# Patient Record
Sex: Male | Born: 1965
Health system: Southern US, Community
[De-identification: ages and names within clinical notes are randomized; demographics above are authoritative.]

## PROBLEM LIST (undated history)

## (undated) DIAGNOSIS — E785 Hyperlipidemia, unspecified: Secondary | ICD-10-CM

## (undated) DIAGNOSIS — E039 Hypothyroidism, unspecified: Secondary | ICD-10-CM

## (undated) DIAGNOSIS — I10 Essential (primary) hypertension: Secondary | ICD-10-CM

## (undated) HISTORY — PX: BACK SURGERY: SHX140

---

## 2018-07-15 ENCOUNTER — Emergency Department (HOSPITAL_BASED_OUTPATIENT_CLINIC_OR_DEPARTMENT_OTHER)
Admission: EM | Admit: 2018-07-15 | Discharge: 2018-07-15 | Disposition: A | Discharge status: Home / Self Care | Payer: Federal, State, Local not specified - PPO | Attending: Emergency Medicine | Admitting: Emergency Medicine

## 2018-07-15 ENCOUNTER — Other Ambulatory Visit: Payer: Self-pay

## 2018-07-15 ENCOUNTER — Encounter (HOSPITAL_BASED_OUTPATIENT_CLINIC_OR_DEPARTMENT_OTHER): Payer: Self-pay | Admitting: Emergency Medicine

## 2018-07-15 ENCOUNTER — Emergency Department (HOSPITAL_BASED_OUTPATIENT_CLINIC_OR_DEPARTMENT_OTHER): Payer: Federal, State, Local not specified - PPO

## 2018-07-15 DIAGNOSIS — E039 Hypothyroidism, unspecified: Secondary | ICD-10-CM | POA: Diagnosis not present

## 2018-07-15 DIAGNOSIS — R42 Dizziness and giddiness: Secondary | ICD-10-CM | POA: Insufficient documentation

## 2018-07-15 DIAGNOSIS — F17210 Nicotine dependence, cigarettes, uncomplicated: Secondary | ICD-10-CM | POA: Diagnosis not present

## 2018-07-15 DIAGNOSIS — Z20828 Contact with and (suspected) exposure to other viral communicable diseases: Secondary | ICD-10-CM | POA: Insufficient documentation

## 2018-07-15 DIAGNOSIS — H9312 Tinnitus, left ear: Secondary | ICD-10-CM | POA: Diagnosis not present

## 2018-07-15 DIAGNOSIS — H9192 Unspecified hearing loss, left ear: Secondary | ICD-10-CM | POA: Diagnosis not present

## 2018-07-15 HISTORY — DX: Hypothyroidism, unspecified: E03.9

## 2018-07-15 HISTORY — DX: Hyperlipidemia, unspecified: E78.5

## 2018-07-15 LAB — CBC WITH DIFFERENTIAL/PLATELET
Abs Immature Granulocytes: 0.01 10*3/uL (ref 0.00–0.07)
Basophils Absolute: 0 10*3/uL (ref 0.0–0.1)
Basophils Relative: 1 %
Eosinophils Absolute: 0 10*3/uL (ref 0.0–0.5)
Eosinophils Relative: 0 %
HCT: 46.5 % (ref 39.0–52.0)
Hemoglobin: 14.7 g/dL (ref 13.0–17.0)
Immature Granulocytes: 0 %
Lymphocytes Relative: 10 %
Lymphs Abs: 0.6 10*3/uL — ABNORMAL LOW (ref 0.7–4.0)
MCH: 28.9 pg (ref 26.0–34.0)
MCHC: 31.6 g/dL (ref 30.0–36.0)
MCV: 91.4 fL (ref 80.0–100.0)
Monocytes Absolute: 0.5 10*3/uL (ref 0.1–1.0)
Monocytes Relative: 8 %
Neutro Abs: 4.8 10*3/uL (ref 1.7–7.7)
Neutrophils Relative %: 81 %
Platelets: 164 10*3/uL (ref 150–400)
RBC: 5.09 MIL/uL (ref 4.22–5.81)
RDW: 13.6 % (ref 11.5–15.5)
WBC: 5.9 10*3/uL (ref 4.0–10.5)
nRBC: 0 % (ref 0.0–0.2)

## 2018-07-15 LAB — COMPREHENSIVE METABOLIC PANEL
ALT: 16 U/L (ref 0–44)
AST: 20 U/L (ref 15–41)
Albumin: 4.2 g/dL (ref 3.5–5.0)
Alkaline Phosphatase: 54 U/L (ref 38–126)
Anion gap: 8 (ref 5–15)
BUN: 14 mg/dL (ref 6–20)
CO2: 24 mmol/L (ref 22–32)
Calcium: 8.6 mg/dL — ABNORMAL LOW (ref 8.9–10.3)
Chloride: 107 mmol/L (ref 98–111)
Creatinine, Ser: 1.13 mg/dL (ref 0.61–1.24)
GFR calc Af Amer: >60 mL/min (ref >60)
GFR calc non Af Amer: >60 mL/min (ref >60)
Glucose, Bld: 101 mg/dL — ABNORMAL HIGH (ref 70–99)
Potassium: 3.9 mmol/L (ref 3.5–5.1)
Sodium: 139 mmol/L (ref 135–145)
Total Bilirubin: 0.9 mg/dL (ref 0.3–1.2)
Total Protein: 7.2 g/dL (ref 6.5–8.1)

## 2018-07-15 LAB — URINALYSIS, ROUTINE W REFLEX MICROSCOPIC
Bilirubin Urine: NEGATIVE
Glucose, UA: NEGATIVE mg/dL
Hgb urine dipstick: NEGATIVE
Ketones, ur: NEGATIVE mg/dL
Leukocytes,Ua: NEGATIVE
Nitrite: NEGATIVE
Protein, ur: NEGATIVE mg/dL
Specific Gravity, Urine: 1.02 (ref 1.005–1.030)
pH: 6.5 (ref 5.0–8.0)

## 2018-07-15 LAB — TROPONIN I (HIGH SENSITIVITY)
Troponin I (High Sensitivity): 12 ng/L (ref <18)
Troponin I (High Sensitivity): 5 ng/L (ref <18)

## 2018-07-15 MED ORDER — MECLIZINE HCL 25 MG PO TABS: 25.0000 mg | ORAL_TABLET | Freq: Three times a day (TID) | ORAL | 0 refills | Status: DC | PRN

## 2018-07-15 MED ORDER — MECLIZINE HCL 25 MG PO TABS
25.0000 mg | ORAL_TABLET | Freq: Once | ORAL | Status: AC
  Administered 2018-07-15: 25 mg via ORAL
  Filled 2018-07-15: qty 1

## 2018-07-15 MED ORDER — SODIUM CHLORIDE 0.9 % IV BOLUS
1000.0000 mL | Freq: Once | INTRAVENOUS | Status: AC
  Administered 2018-07-15: 1000 mL via INTRAVENOUS

## 2018-07-15 MED FILL — MECLIZINE 25 MG TABLET: 25 | 10 days supply | Qty: 30 | Fill #0

## 2018-07-15 NOTE — ED Triage Notes (Signed)
Pt sent from [facility] with dizziness that persisted a bit longer than his normal vertigo. Paperwork from that facility shows normal EKG. Dizziness is improving. Hx of high cholesterol and hypothyroid.

## 2018-07-15 NOTE — ED Notes (Signed)
Pt walked to XR.

## 2018-07-15 NOTE — ED Provider Notes (Signed)
[facility] EMERGENCY DEPARTMENT Provider Note   CSN: [id number] Arrival date & time: 07/15/18  1027    History   Chief Complaint Chief Complaint  Patient presents with  . Dizziness    HPI Michael Castillo is a 53 y.o. male.     Pt presents to the ED today with dizziness.  Pt said it started on his way to work this morning.  He has vertigo, but usually that goes away quickly.  This did not.  He called his wife after he did not feel safe driving.  She initially took him to urgent care who sent him here.  Pt's wife requesting a covid test.  He denies any recent exposures.  No sob or cough or fevers.  Dizziness is improving.  He has tinnitus in his left ear and hearing loss, but those are both chronic.     Past Medical History:  Diagnosis Date  . Hyperlipidemia   . Hypothyroidism     There are no active problems to display for this patient.   Past Surgical History:  Procedure Laterality Date  . BACK SURGERY          Home Medications    Prior to Admission medications   Medication Sig Start Date End Date Taking? Authorizing Provider  meclizine (ANTIVERT) 25 MG tablet Take 1 tablet (25 mg total) by mouth 3 (three) times daily as needed for dizziness. 07/15/18   Isla Pence, MD    Family History History reviewed. No pertinent family history.  Social History Social History   Tobacco Use  . Smoking status: Current Every Day Smoker    Types: Cigarettes  . Smokeless tobacco: Never Used  Substance Use Topics  . Alcohol use: Yes    Comment: occ  . Drug use: Never     Allergies   Patient has no allergy information on record.   Review of Systems Review of Systems  HENT: Positive for hearing loss and tinnitus.   Neurological: Positive for dizziness.  All other systems reviewed and are negative.    Physical Exam Updated Vital Signs BP 126/86 (BP Location: Right Arm)   Pulse 73   Temp 97.6 F (36.4 C) (Oral)   Resp 18   Ht 6\' 2"  (1.88 m)    Wt 95.3 kg   SpO2 99%   BMI 26.96 kg/m   Physical Exam Vitals signs and nursing note reviewed.  Constitutional:      Appearance: Normal appearance.  HENT:     Head: Normocephalic and atraumatic.     Right Ear: External ear normal.     Left Ear: External ear normal.     Nose: Nose normal.     Mouth/Throat:     Mouth: Mucous membranes are moist.     Pharynx: Oropharynx is clear.  Eyes:     Extraocular Movements: Extraocular movements intact.     Conjunctiva/sclera: Conjunctivae normal.     Pupils: Pupils are equal, round, and reactive to light.  Neck:     Musculoskeletal: Normal range of motion and neck supple.  Cardiovascular:     Rate and Rhythm: Normal rate and regular rhythm.     Pulses: Normal pulses.     Heart sounds: Normal heart sounds.  Pulmonary:     Effort: Pulmonary effort is normal.     Breath sounds: Normal breath sounds.  Abdominal:     General: Abdomen is flat. Bowel sounds are normal.     Palpations: Abdomen is soft.  Musculoskeletal: Normal  range of motion.  Skin:    General: Skin is warm.     Capillary Refill: Capillary refill takes less than 2 seconds.  Neurological:     General: No focal deficit present.     Mental Status: He is alert and oriented to person, place, and time.  Psychiatric:        Mood and Affect: Mood normal.        Behavior: Behavior normal.      ED Treatments / Results  Labs (all labs ordered are listed, but only abnormal results are displayed) Labs Reviewed  CBC WITH DIFFERENTIAL/PLATELET - Abnormal; Notable for the following components:      Result Value   Lymphs Abs 0.6 (*)    All other components within normal limits  COMPREHENSIVE METABOLIC PANEL - Abnormal; Notable for the following components:   Glucose, Bld 101 (*)    Calcium 8.6 (*)    All other components within normal limits  NOVEL CORONAVIRUS, NAA (HOSPITAL ORDER, SEND-OUT TO REF LAB)  URINALYSIS, ROUTINE W REFLEX MICROSCOPIC  TROPONIN I (HIGH SENSITIVITY)   TROPONIN I (HIGH SENSITIVITY)    EKG EKG Interpretation  Date/Time:  Wednesday July 15 2018 11:00:53 EDT Ventricular Rate:  57 PR Interval:    QRS Duration: 97 QT Interval:  425 QTC Calculation: 414 R Axis:   70 Text Interpretation:  Sinus rhythm Minimal ST elevation, inferior leads No old tracing to compare Confirmed by Jacalyn LefevreHaviland, Rolen Conger [phone]([id number]) on 07/15/2018 1:42:32 PM   Radiology Ct Head Wo Contrast  Result Date: 07/15/2018 CLINICAL DATA:  53 year old male with acute dizziness while driving to work this morning. EXAM: CT HEAD WITHOUT CONTRAST TECHNIQUE: Contiguous axial images were obtained from the base of the skull through the vertex without intravenous contrast. COMPARISON:  None. FINDINGS: Brain: Cerebral volume is within normal limits for age. No midline shift, ventriculomegaly, mass effect, evidence of mass lesion, intracranial hemorrhage or evidence of cortically based acute infarction. Gray-white matter differentiation is within normal limits throughout the brain. Vascular: Mild Calcified atherosclerosis at the skull base. No suspicious intracranial vascular hyperdensity. Skull: Negative. Sinuses/Orbits: Visualized paranasal sinuses and mastoids are clear. Tympanic cavities are clear. Other: No acute orbit or scalp soft tissue findings. IMPRESSION: Normal noncontrast CT appearance of the brain. Electronically Signed   By: Odessa FlemingH  Hall M.D.   On: 07/15/2018 11:11    Procedures Procedures (including critical care time)  Medications Ordered in ED Medications  sodium chloride 0.9 % bolus 1,000 mL (0 mLs Intravenous Stopped 07/15/18 1213)  meclizine (ANTIVERT) tablet 25 mg (25 mg Oral Given 07/15/18 1110)     Initial Impression / Assessment and Plan / ED Course  I have reviewed the triage vital signs and the nursing notes.  Pertinent labs & imaging results that were available during my care of the patient were reviewed by me and considered in my medical decision making (see chart for  details).    Covid test pending.   Pt is feeling much better.  He is able to ambulate without problems.  Return if worse.  Waneta MartinsJames Stollings was evaluated in Emergency Department on 07/15/2018 for the symptoms described in the history of present illness. He was evaluated in the context of the global COVID-19 pandemic, which necessitated consideration that the patient might be at risk for infection with the SARS-CoV-2 virus that causes COVID-19. Institutional protocols and algorithms that pertain to the evaluation of patients at risk for COVID-19 are in a state of rapid change based on information  released by regulatory bodies including the CDC and federal and state organizations. These policies and algorithms were followed during the patient's care in the ED.  Final Clinical Impressions(s) / ED Diagnoses   Final diagnoses:  Vertigo    ED Discharge Orders         Ordered    meclizine (ANTIVERT) 25 MG tablet  3 times daily PRN     07/15/18 1352           Jacalyn LefevreHaviland, Emya Picado, MD 07/15/18 1354

## 2018-07-15 NOTE — ED Notes (Signed)
Spoke with wife to update on condition.  

## 2018-07-15 NOTE — ED Notes (Signed)
Pt given urinal.

## 2018-07-15 NOTE — ED Notes (Signed)
Pt wife stated that pt called her and said he was cold and needed to use the restroom. I redirected the patient to his call bell if he needed anything and let him know that I am always happy to help him with anything he needs.

## 2018-07-17 LAB — NOVEL CORONAVIRUS, NAA (HOSP ORDER, SEND-OUT TO REF LAB; TAT 18-24 HRS): SARS-CoV-2, NAA: NOT DETECTED

## 2020-02-28 IMAGING — CT CT HEAD WITHOUT CONTRAST
3 series · 16 of 47 positions shown, 19 images · non-contrast
Comparison: None.

CLINICAL DATA: 53-year-old male with acute dizziness while driving
to work this morning.

EXAM:
CT HEAD WITHOUT CONTRAST
TECHNIQUE: Contiguous axial images were obtained from the base of the skull
through the vertex without intravenous contrast.

[Series 2: head wo · axial · 0.42mm/px · z∈[-189,-44]mm · 10 of 35 slices shown, 13 images]
[im 3/35  brain]
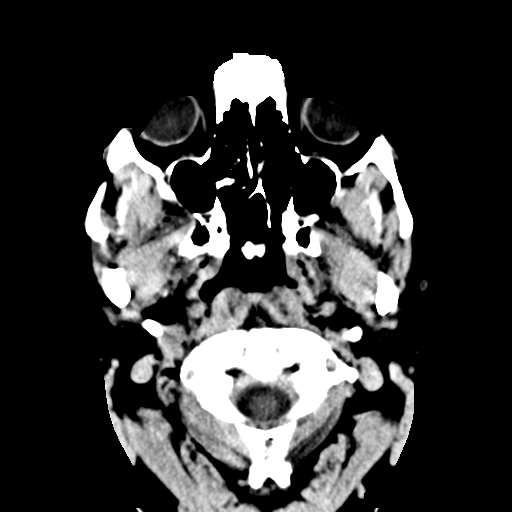
[im 3/35  bone]
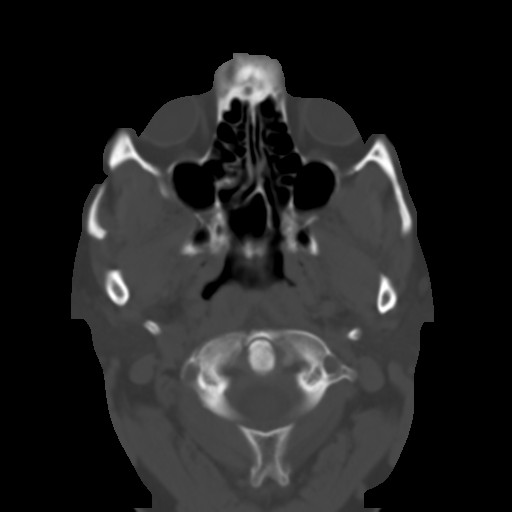
[im 6/35  brain]
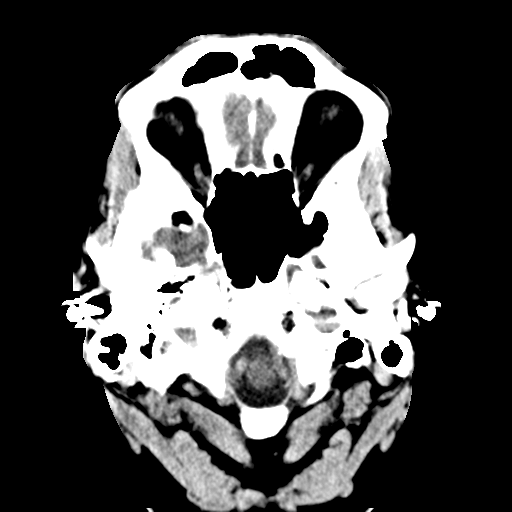
[im 10/35  brain]
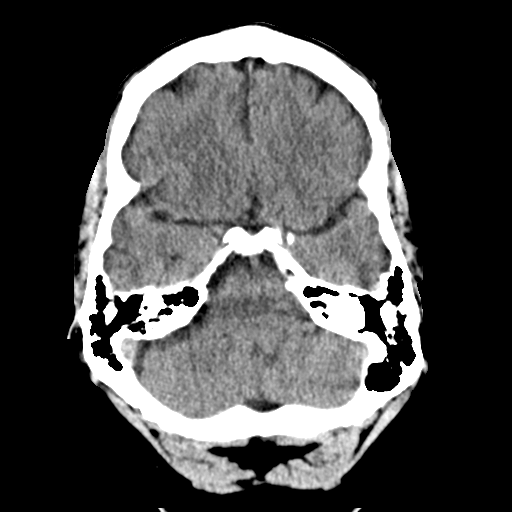
[im 12/35  brain]
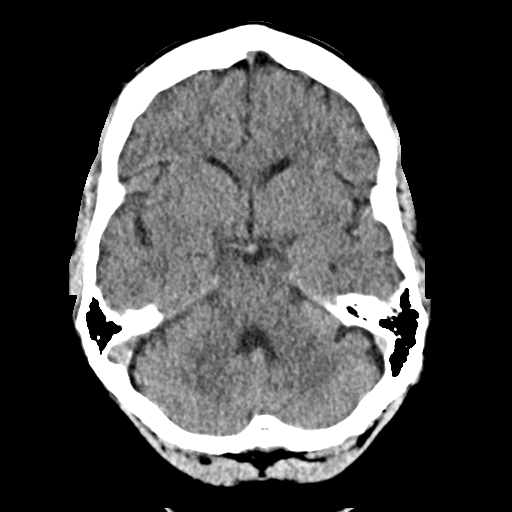
[im 16/35  brain]
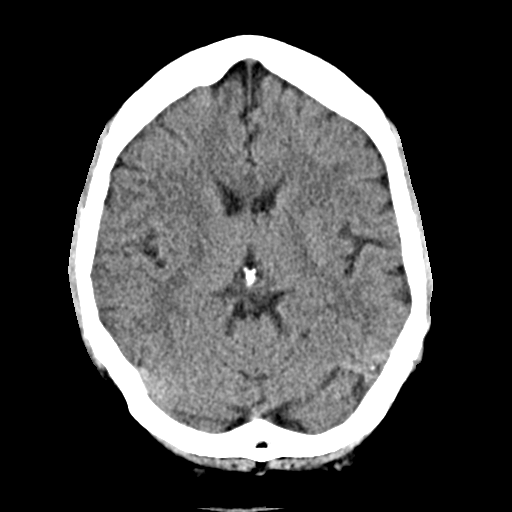
[im 16/35  bone]
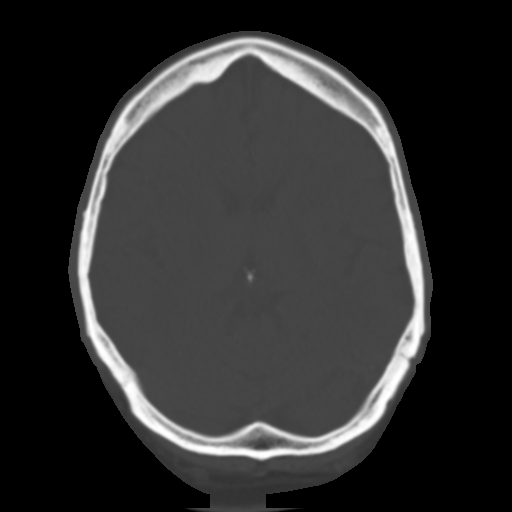
[im 19/35  brain]
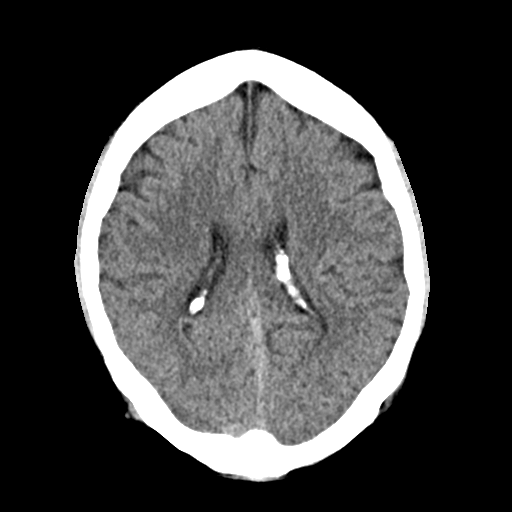
[im 23/35  brain]
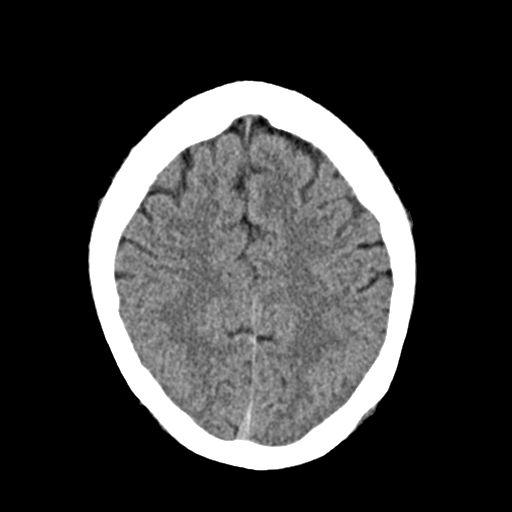
[im 26/35  brain]
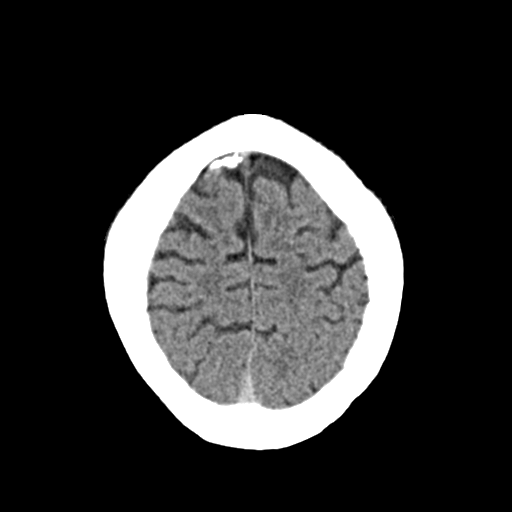
[im 29/35  brain]
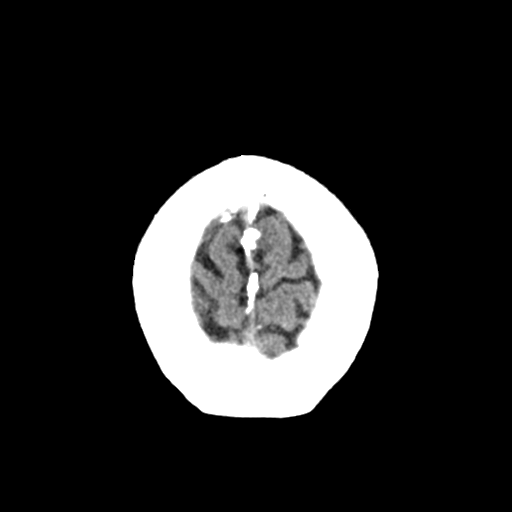
[im 29/35  bone]
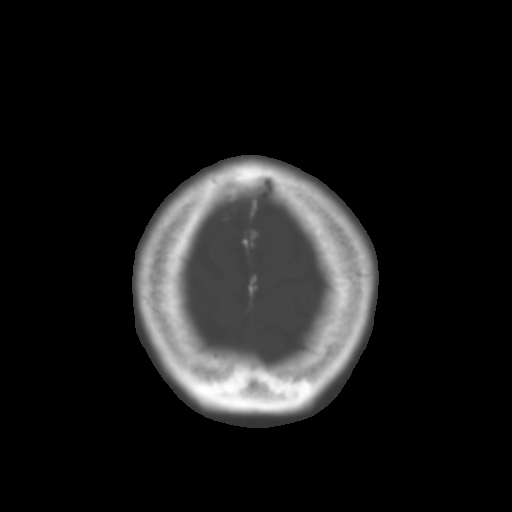
[im 32/35  brain]
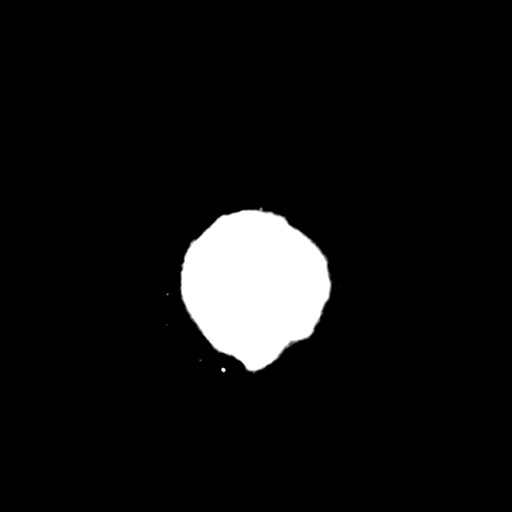

[Series 4: coronal soft · coronal · 0.35mm/px · 3 of 73 slices shown]
[im 25/73  brain]
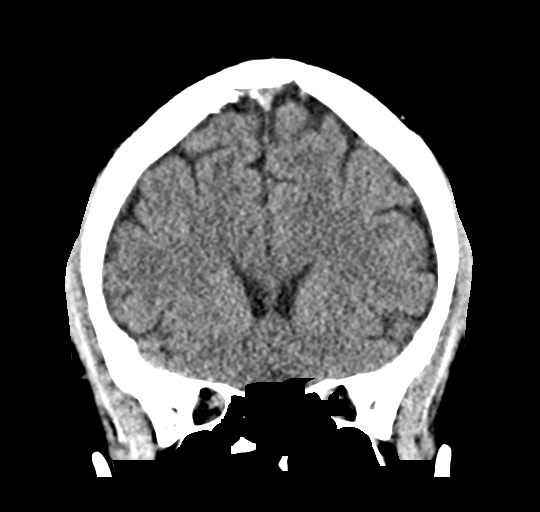
[im 33/73  brain]
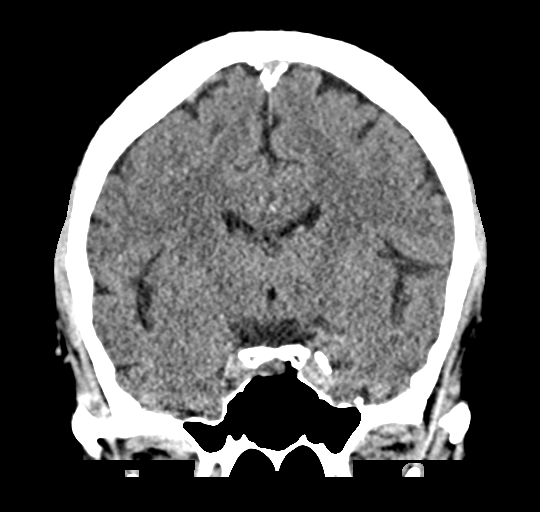
[im 41/73  brain]
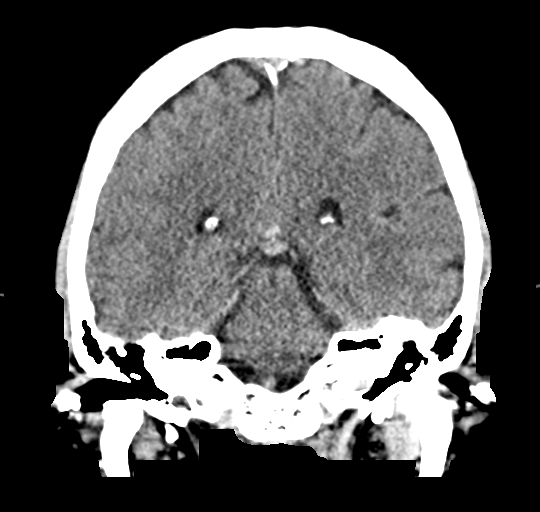

[Series 5: sag soft · sagittal · 0.36mm/px · 3 of 58 slices shown]
[im 20/58  brain]
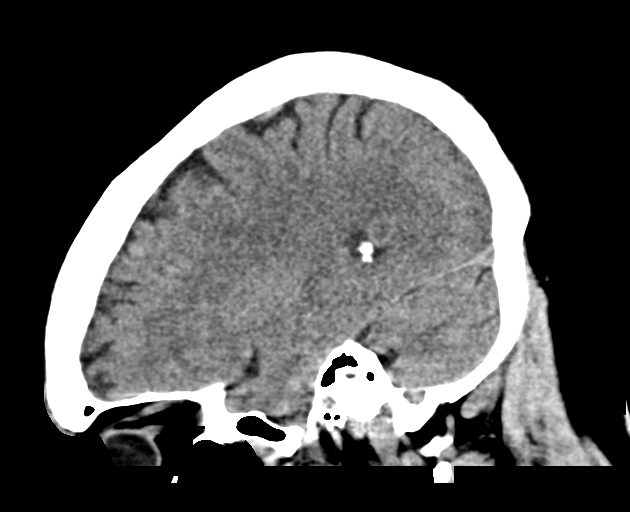
[im 29/58  brain]
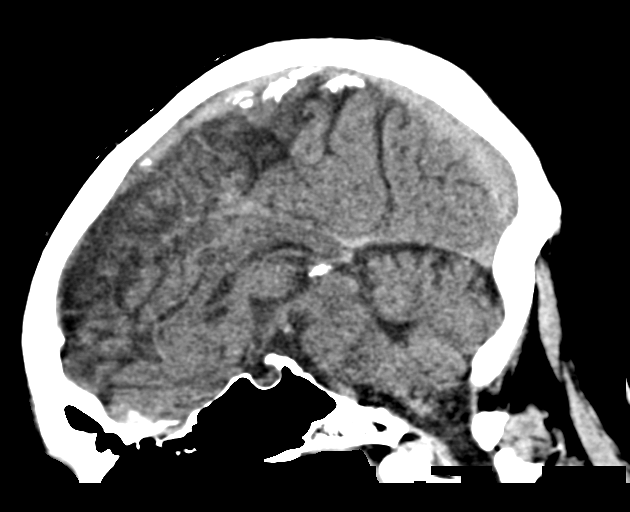
[im 39/58  brain]
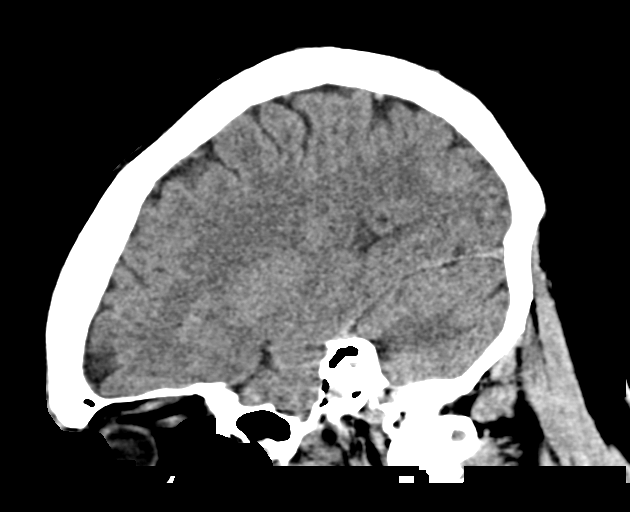

[16 of 47 positions shown; findings below may reference images not displayed]

FINDINGS: Brain: Cerebral volume is within normal limits for age. No midline
shift, ventriculomegaly, mass effect, evidence of mass lesion,
intracranial hemorrhage or evidence of cortically based acute
infarction. Gray-white matter differentiation is within normal
limits throughout the brain.

Vascular: Mild Calcified atherosclerosis at the skull base. No
suspicious intracranial vascular hyperdensity.

Skull: Negative.

Sinuses/Orbits: Visualized paranasal sinuses and mastoids are clear.
Tympanic cavities are clear.

Other: No acute orbit or scalp soft tissue findings.
IMPRESSION: Normal noncontrast CT appearance of the brain.

## 2020-12-27 ENCOUNTER — Emergency Department (HOSPITAL_BASED_OUTPATIENT_CLINIC_OR_DEPARTMENT_OTHER): Payer: Federal, State, Local not specified - PPO

## 2020-12-27 ENCOUNTER — Other Ambulatory Visit: Payer: Self-pay

## 2020-12-27 ENCOUNTER — Observation Stay (HOSPITAL_BASED_OUTPATIENT_CLINIC_OR_DEPARTMENT_OTHER)
Admission: EM | Admit: 2020-12-27 | Discharge: 2020-12-29 | Disposition: A | Discharge status: Home / Self Care | Payer: Federal, State, Local not specified - PPO | Attending: Internal Medicine | Admitting: Internal Medicine

## 2020-12-27 ENCOUNTER — Encounter (HOSPITAL_BASED_OUTPATIENT_CLINIC_OR_DEPARTMENT_OTHER): Payer: Self-pay

## 2020-12-27 DIAGNOSIS — F17210 Nicotine dependence, cigarettes, uncomplicated: Secondary | ICD-10-CM | POA: Insufficient documentation

## 2020-12-27 DIAGNOSIS — E039 Hypothyroidism, unspecified: Secondary | ICD-10-CM | POA: Insufficient documentation

## 2020-12-27 DIAGNOSIS — Z20822 Contact with and (suspected) exposure to covid-19: Secondary | ICD-10-CM | POA: Insufficient documentation

## 2020-12-27 DIAGNOSIS — Z79899 Other long term (current) drug therapy: Secondary | ICD-10-CM | POA: Insufficient documentation

## 2020-12-27 DIAGNOSIS — I129 Hypertensive chronic kidney disease with stage 1 through stage 4 chronic kidney disease, or unspecified chronic kidney disease: Secondary | ICD-10-CM | POA: Insufficient documentation

## 2020-12-27 DIAGNOSIS — N179 Acute kidney failure, unspecified: Secondary | ICD-10-CM | POA: Insufficient documentation

## 2020-12-27 DIAGNOSIS — E669 Obesity, unspecified: Secondary | ICD-10-CM

## 2020-12-27 DIAGNOSIS — E861 Hypovolemia: Secondary | ICD-10-CM | POA: Diagnosis not present

## 2020-12-27 DIAGNOSIS — K922 Gastrointestinal hemorrhage, unspecified: Principal | ICD-10-CM | POA: Insufficient documentation

## 2020-12-27 DIAGNOSIS — E785 Hyperlipidemia, unspecified: Secondary | ICD-10-CM

## 2020-12-27 DIAGNOSIS — N1831 Chronic kidney disease, stage 3a: Secondary | ICD-10-CM | POA: Diagnosis not present

## 2020-12-27 DIAGNOSIS — K5790 Diverticulosis of intestine, part unspecified, without perforation or abscess without bleeding: Secondary | ICD-10-CM

## 2020-12-27 DIAGNOSIS — K921 Melena: Secondary | ICD-10-CM

## 2020-12-27 HISTORY — DX: Essential (primary) hypertension: I10

## 2020-12-27 LAB — CBC WITH DIFFERENTIAL/PLATELET
Abs Immature Granulocytes: 0 10*3/uL (ref 0.00–0.07)
Abs Immature Granulocytes: 0.02 10*3/uL (ref 0.00–0.07)
Abs Immature Granulocytes: 0.04 10*3/uL (ref 0.00–0.07)
Basophils Absolute: 0 10*3/uL (ref 0.0–0.1)
Basophils Absolute: 0 10*3/uL (ref 0.0–0.1)
Basophils Absolute: 0 10*3/uL (ref 0.0–0.1)
Basophils Relative: 1 %
Basophils Relative: 0 %
Basophils Relative: 0 %
Eosinophils Absolute: 0.1 10*3/uL (ref 0.0–0.5)
Eosinophils Absolute: 0 10*3/uL (ref 0.0–0.5)
Eosinophils Absolute: 0 10*3/uL (ref 0.0–0.5)
Eosinophils Relative: 1 %
Eosinophils Relative: 0 %
Eosinophils Relative: 0 %
HCT: 38.6 % — ABNORMAL LOW (ref 39.0–52.0)
HCT: 32.6 % — ABNORMAL LOW (ref 39.0–52.0)
HCT: 33.5 % — ABNORMAL LOW (ref 39.0–52.0)
Hemoglobin: 12.5 g/dL — ABNORMAL LOW (ref 13.0–17.0)
Hemoglobin: 10.8 g/dL — ABNORMAL LOW (ref 13.0–17.0)
Hemoglobin: 10.8 g/dL — ABNORMAL LOW (ref 13.0–17.0)
Immature Granulocytes: 0 %
Immature Granulocytes: 0 %
Immature Granulocytes: 1 %
Lymphocytes Relative: 37 %
Lymphocytes Relative: 15 %
Lymphocytes Relative: 12 %
Lymphs Abs: 1.3 10*3/uL (ref 0.7–4.0)
Lymphs Abs: 1 10*3/uL (ref 0.7–4.0)
Lymphs Abs: 0.8 10*3/uL (ref 0.7–4.0)
MCH: 28.8 pg (ref 26.0–34.0)
MCH: 29.4 pg (ref 26.0–34.0)
MCH: 29 pg (ref 26.0–34.0)
MCHC: 32.4 g/dL (ref 30.0–36.0)
MCHC: 33.1 g/dL (ref 30.0–36.0)
MCHC: 32.2 g/dL (ref 30.0–36.0)
MCV: 88.9 fL (ref 80.0–100.0)
MCV: 88.8 fL (ref 80.0–100.0)
MCV: 89.8 fL (ref 80.0–100.0)
Monocytes Absolute: 0.4 10*3/uL (ref 0.1–1.0)
Monocytes Absolute: 0.6 10*3/uL (ref 0.1–1.0)
Monocytes Absolute: 0.4 10*3/uL (ref 0.1–1.0)
Monocytes Relative: 12 %
Monocytes Relative: 9 %
Monocytes Relative: 7 %
Neutro Abs: 1.7 10*3/uL (ref 1.7–7.7)
Neutro Abs: 5.4 10*3/uL (ref 1.7–7.7)
Neutro Abs: 5.4 10*3/uL (ref 1.7–7.7)
Neutrophils Relative %: 49 %
Neutrophils Relative %: 76 %
Neutrophils Relative %: 80 %
Platelets: 195 10*3/uL (ref 150–400)
Platelets: 134 10*3/uL — ABNORMAL LOW (ref 150–400)
Platelets: 158 10*3/uL (ref 150–400)
RBC: 4.34 MIL/uL (ref 4.22–5.81)
RBC: 3.67 MIL/uL — ABNORMAL LOW (ref 4.22–5.81)
RBC: 3.73 MIL/uL — ABNORMAL LOW (ref 4.22–5.81)
RDW: 13.7 % (ref 11.5–15.5)
RDW: 13.7 % (ref 11.5–15.5)
RDW: 13.9 % (ref 11.5–15.5)
WBC: 3.5 10*3/uL — ABNORMAL LOW (ref 4.0–10.5)
WBC: 7.1 10*3/uL (ref 4.0–10.5)
WBC: 6.7 10*3/uL (ref 4.0–10.5)
nRBC: 0 % (ref 0.0–0.2)
nRBC: 0 % (ref 0.0–0.2)
nRBC: 0 % (ref 0.0–0.2)

## 2020-12-27 LAB — COMPREHENSIVE METABOLIC PANEL
ALT: 21 U/L (ref 0–44)
AST: 25 U/L (ref 15–41)
Albumin: 3.6 g/dL (ref 3.5–5.0)
Alkaline Phosphatase: 45 U/L (ref 38–126)
Anion gap: 8 (ref 5–15)
BUN: 18 mg/dL (ref 6–20)
CO2: 23 mmol/L (ref 22–32)
Calcium: 8.7 mg/dL — ABNORMAL LOW (ref 8.9–10.3)
Chloride: 105 mmol/L (ref 98–111)
Creatinine, Ser: 1.45 mg/dL — ABNORMAL HIGH (ref 0.61–1.24)
GFR, Estimated: 56 mL/min — ABNORMAL LOW (ref >60)
Glucose, Bld: 160 mg/dL — ABNORMAL HIGH (ref 70–99)
Potassium: 4 mmol/L (ref 3.5–5.1)
Sodium: 136 mmol/L (ref 135–145)
Total Bilirubin: 0.7 mg/dL (ref 0.3–1.2)
Total Protein: 6.7 g/dL (ref 6.5–8.1)

## 2020-12-27 LAB — OCCULT BLOOD X 1 CARD TO LAB, STOOL: Fecal Occult Bld: POSITIVE — AB

## 2020-12-27 LAB — RESP PANEL BY RT-PCR (FLU A&B, COVID) ARPGX2
Influenza A by PCR: NEGATIVE
Influenza B by PCR: NEGATIVE
SARS Coronavirus 2 by RT PCR: NEGATIVE

## 2020-12-27 LAB — IRON AND TIBC
Iron: 85 ug/dL (ref 45–182)
Saturation Ratios: 29 % (ref 17.9–39.5)
TIBC: 289 ug/dL (ref 250–450)
UIBC: 204 ug/dL

## 2020-12-27 LAB — LIPASE, BLOOD: Lipase: 28 U/L (ref 11–51)

## 2020-12-27 LAB — PROTIME-INR
INR: 1.1 (ref 0.8–1.2)
Prothrombin Time: 14.3 seconds (ref 11.4–15.2)

## 2020-12-27 LAB — TYPE AND SCREEN
ABO/RH(D): O POS
Antibody Screen: NEGATIVE

## 2020-12-27 LAB — MRSA NEXT GEN BY PCR, NASAL: MRSA by PCR Next Gen: NOT DETECTED

## 2020-12-27 MED ORDER — MIDAZOLAM HCL 2 MG/2ML IJ SOLN
INTRAMUSCULAR | Status: AC
  Filled 2020-12-27: qty 2

## 2020-12-27 MED ORDER — ACETAMINOPHEN 325 MG PO TABS: 650.0000 mg | ORAL_TABLET | Freq: Four times a day (QID) | ORAL | Status: DC | PRN

## 2020-12-27 MED ORDER — SODIUM CHLORIDE 0.9 % IV SOLN: Freq: Once | INTRAVENOUS | Status: AC

## 2020-12-27 MED ORDER — CHLORHEXIDINE GLUCONATE CLOTH 2 % EX PADS
6.0000 | MEDICATED_PAD | Freq: Every day | CUTANEOUS | Status: DC
  Administered 2020-12-27 – 2020-12-28 (×2): 6 via TOPICAL

## 2020-12-27 MED ORDER — ACETAMINOPHEN 650 MG RE SUPP: 650.0000 mg | Freq: Four times a day (QID) | RECTAL | Status: DC | PRN

## 2020-12-27 MED ORDER — ONDANSETRON HCL 4 MG PO TABS: 4.0000 mg | ORAL_TABLET | Freq: Four times a day (QID) | ORAL | Status: DC | PRN

## 2020-12-27 MED ORDER — SODIUM CHLORIDE 0.9% FLUSH
3.0000 mL | Freq: Two times a day (BID) | INTRAVENOUS | Status: DC
  Administered 2020-12-27 – 2020-12-29 (×3): 3 mL via INTRAVENOUS

## 2020-12-27 MED ORDER — SODIUM CHLORIDE 0.9 % IV BOLUS
1000.0000 mL | Freq: Once | INTRAVENOUS | Status: AC
  Administered 2020-12-27: 14:00:00 1000 mL via INTRAVENOUS

## 2020-12-27 MED ORDER — ONDANSETRON HCL 4 MG/2ML IJ SOLN: 4.0000 mg | Freq: Four times a day (QID) | INTRAMUSCULAR | Status: DC | PRN

## 2020-12-27 MED ORDER — SODIUM CHLORIDE 0.9 % IV BOLUS: 1000.0000 mL | Freq: Once | INTRAVENOUS | Status: DC

## 2020-12-27 MED ORDER — SODIUM CHLORIDE 0.9 % IV BOLUS
1000.0000 mL | Freq: Once | INTRAVENOUS | Status: AC
  Administered 2020-12-27: 10:00:00 1000 mL via INTRAVENOUS

## 2020-12-27 MED ORDER — IOHEXOL 350 MG/ML SOLN
100.0000 mL | Freq: Once | INTRAVENOUS | Status: AC | PRN
  Administered 2020-12-27: 10:00:00 100 mL via INTRAVENOUS

## 2020-12-27 NOTE — H&P (Signed)
History and Physical    Dahir Ayer GQQ:[id number] DOB: 03-19-1965 DOA: 12/27/2020  PCP: Macy Mis, MD  Patient coming from: work  Chief Complaint: bloody BM  HPI: Michael Castillo is a 55 y.o. male with medical history significant of HLD, hypothyroidism, HTN. Presenting with rectal bleeding. Symptoms started this morning. He had a BM this morning that was hard. At the time, he noticed that it had blood mixed in the stool and some blood on his toilet tissue. He did not have any abdominal or rectal pain. He dismissed it and went to work. At work, he had additional BMs that were loose and bloody. He did not have any N/V, CP, or lightheadedness/dizziness. After having a third bloody BM at work, he decided to come to the ED.    ED Course: He was noted to have an Hgb of 12.5. He had an additional bloody BM where he had what appeared to be a syncopal or near syncopal episode. He became hypotensive and weakly responsive. He recovered and was fluid resuscitated. [facility] was consulted. [facility] was called for admission.   Review of Systems:  Denies CP, dyspnea, palpitations, abdominal pain, rectal pain, N/V, fever. Review of systems is otherwise negative for all not mentioned in HPI.   PMHx Past Medical History:  Diagnosis Date   Hyperlipidemia    Hypertension    Hypothyroidism     PSHx Past Surgical History:  Procedure Laterality Date   BACK SURGERY      SocHx  reports that he has been smoking cigarettes. He has never used smokeless tobacco. He reports current alcohol use. He reports that he does not use drugs.  No Known Allergies  FamHx No family history on file.  Prior to Admission medications   Medication Sig Start Date End Date Taking? Authorizing Provider  atorvastatin (LIPITOR) 40 MG tablet Take 40 mg by mouth daily. 12/27/20   [provider]  levothyroxine (SYNTHROID) 112 MCG tablet Take 112 mcg by mouth daily. 12/17/20   [provider]  meclizine  (ANTIVERT) 25 MG tablet Take 1 tablet (25 mg total) by mouth 3 (three) times daily as needed for dizziness. 07/15/18   Jacalyn Lefevre, MD    Physical Exam: Vitals:   12/27/20 1355 12/27/20 1358 12/27/20 1400 12/27/20 1500  BP: 97/60 102/60 102/60 136/79  Pulse: 74 71 71 69  Resp: 16 16 14  (!) 22  Temp:  97.9 F (36.6 C)  98 F (36.7 C)  TempSrc:  Oral  Oral  SpO2: 98% 100% 98% 100%  Weight:      Height:        General: 55 y.o. male resting in bed in NAD Eyes: PERRL, normal sclera ENMT: Nares patent w/o discharge, orophaynx clear, dentition normal, ears w/o discharge/lesions/ulcers Neck: Supple, trachea midline Cardiovascular: RRR, +S1, S2, no m/g/r, equal pulses throughout Respiratory: CTABL, no w/r/r, normal WOB GI: BS+, NDNT, no masses noted, no organomegaly noted MSK: No e/c/c Neuro: A&O x 3, no focal deficits Psyc: Appropriate interaction and affect, calm/cooperative  Labs on Admission: I have personally reviewed following labs and imaging studies  CBC: Recent Labs  Lab 12/27/20 0939 12/27/20 1340  WBC 3.5* 7.1  NEUTROABS 1.7 5.4  HGB 12.5* 10.8*  HCT 38.6* 32.6*  MCV 88.9 88.8  PLT 195 134*   Basic Metabolic Panel: Recent Labs  Lab 12/27/20 0939  NA 136  K 4.0  CL 105  CO2 23  GLUCOSE 160*  BUN 18  CREATININE 1.45*  CALCIUM 8.7*   GFR: Estimated Creatinine Clearance: 78.2 mL/min (A) (by C-G formula based on SCr of 1.45 mg/dL (H)). Liver Function Tests: Recent Labs  Lab 12/27/20 0939  AST 25  ALT 21  ALKPHOS 45  BILITOT 0.7  PROT 6.7  ALBUMIN 3.6   Recent Labs  Lab 12/27/20 0939  LIPASE 28   No results for input(s): AMMONIA in the last 168 hours. Coagulation Profile: Recent Labs  Lab 12/27/20 0939  INR 1.1   Cardiac Enzymes: No results for input(s): CKTOTAL, CKMB, CKMBINDEX, TROPONINI in the last 168 hours. BNP (last 3 results) No results for input(s): PROBNP in the last 8760 hours. HbA1C: No results for input(s): HGBA1C in the  last 72 hours. CBG: No results for input(s): GLUCAP in the last 168 hours. Lipid Profile: No results for input(s): CHOL, HDL, LDLCALC, TRIG, CHOLHDL, LDLDIRECT in the last 72 hours. Thyroid Function Tests: No results for input(s): TSH, T4TOTAL, FREET4, T3FREE, THYROIDAB in the last 72 hours. Anemia Panel: No results for input(s): VITAMINB12, FOLATE, FERRITIN, TIBC, IRON, RETICCTPCT in the last 72 hours. Urine analysis:    Component Value Date/Time   COLORURINE YELLOW 07/15/2018 1057   APPEARANCEUR CLEAR 07/15/2018 1057   LABSPEC 1.020 07/15/2018 1057   PHURINE 6.5 07/15/2018 1057   GLUCOSEU NEGATIVE 07/15/2018 1057   HGBUR NEGATIVE 07/15/2018 1057   BILIRUBINUR NEGATIVE 07/15/2018 1057   KETONESUR NEGATIVE 07/15/2018 1057   PROTEINUR NEGATIVE 07/15/2018 1057   NITRITE NEGATIVE 07/15/2018 1057   LEUKOCYTESUR NEGATIVE 07/15/2018 1057    Radiological Exams on Admission: CT ANGIO GI BLEED  Result Date: 12/27/2020 CLINICAL DATA:  Hematochezia, bright red blood per rectum EXAM: CTA ABDOMEN AND PELVIS WITHOUT AND WITH CONTRAST TECHNIQUE: Multidetector CT imaging of the abdomen and pelvis was performed using the standard protocol during bolus administration of intravenous contrast. Multiplanar reconstructed images and MIPs were obtained and reviewed to evaluate the vascular anatomy. CONTRAST:  OMNIPAQUE IOHEXOL 350 MG/ML SOLN COMPARISON:  None. FINDINGS: VASCULAR Normal contour and caliber of the abdominal aorta. No evidence of aneurysm, dissection, or other acute aortic pathology. Standard branching pattern of the abdominal aorta with solitary bilateral renal arteries. Minimal, scattered atherosclerosis. Review of the MIP images confirms the above findings. NON-VASCULAR Lower chest: No acute abnormality. Hepatobiliary: No focal liver abnormality is seen. No gallstones, gallbladder wall thickening, or biliary dilatation. Pancreas: Unremarkable. No pancreatic ductal dilatation or  surrounding inflammatory changes. Spleen: Normal in size without focal abnormality. Adrenals/Urinary Tract: Adrenal glands are unremarkable. Simple, benign bilateral renal cysts. Kidneys are otherwise normal, without renal calculi, focal lesion, or hydronephrosis. Thickening of the urinary bladder, likely secondary to chronic outlet obstruction. Stomach/Bowel: Stomach is within normal limits. Appendix appears normal. No evidence of bowel wall thickening, distention, or inflammatory changes. Descending and sigmoid diverticulosis. The colon is fluid-filled to the rectum. Lymphatic: No significant vascular findings are present. No enlarged abdominal or pelvic lymph nodes. Reproductive: Uterus and bilateral adnexa are unremarkable. Other: No abdominal wall hernia or abnormality. No abdominopelvic ascites. Musculoskeletal: No fracture is seen. IMPRESSION: 1. No CT findings to explain or localize reported GI bleeding. No intraluminal contrast extravasation. 2. Descending and sigmoid diverticulosis without evidence of acute diverticulitis. 3. The colon is fluid-filled to the rectum, consistent with diarrheal illness. 4. Normal contour and caliber of the abdominal aorta without evidence of aneurysm, dissection, or other acute aortic pathology. Aortic Atherosclerosis (ICD10-I70.0). Electronically Signed   By: Jearld Lesch M.D.   On: 12/27/2020 10:36    EKG:  Independently reviewed. Sinus, no st elevations  Assessment/Plan GIB Syncopal episode w/ hypotension     - placed in obs, SDU     - check q6h H&H, iron studies, type and screen     - transfuse for Hgb < 7     - [facility] has seen, appreciate eval; will attempt to acquire previous c-scope recorded ([facility] 5 - 7 years ago, records request placed)     - CLD for now, NPOpMN     - echo for syncope w/u; likely cause his Hgb drop  HTN     - hold BP meds tonight  HLD     - can continue home regimen when confirmed  Hypothyroidism      - can  continue home regimen when confirmed  CKD3a     - Scr is stable from his labs in care everywhere from earlier this month     - follow for now     - watch nephrotoxins  DVT prophylaxis: SCDs  Code Status: FULL  Family Communication: None at bedside  Consults called: [facility] (Dr. Bosie Clos)   Status is: Observation  The patient remains OBS appropriate and will d/c before 2 midnights.  Time spent coordinating admission: 45 minutes  Nazli Penn A Elleni Mozingo DO [facility]  If 7PM-7AM, please contact night-coverage [email or web address]  12/27/2020, 3:09 PM

## 2020-12-27 NOTE — ED Notes (Signed)
Pants and underwear cut off patient in emergency episode of passing large bloody stool where patient became unresponsive. Pt cell phone given to wife and clothing placed in patient belong bag.

## 2020-12-27 NOTE — ED Provider Notes (Addendum)
[facility] EMERGENCY DEPARTMENT Provider Note   CSN: [id number] Arrival date & time: 12/27/20  2683     History Chief Complaint  Patient presents with   Hematochezia    Michael Castillo is a 55 y.o. male.  The history is provided by the patient.  Rectal Bleeding Quality:  Bright red Amount:  Moderate Duration:  2 hours Timing:  Intermittent Context: spontaneously   Relieved by:  Nothing Associated symptoms: no abdominal pain, no dizziness, no epistaxis, no fever, no hematemesis, no light-headedness, no loss of consciousness, no recent illness and no vomiting       Past Medical History:  Diagnosis Date   Hyperlipidemia    Hypertension    Hypothyroidism     Patient Active Problem List   Diagnosis Date Noted   GIB (gastrointestinal bleeding) 12/27/2020    Past Surgical History:  Procedure Laterality Date   BACK SURGERY         No family history on file.  Social History   Tobacco Use   Smoking status: Every Day    Types: Cigarettes   Smokeless tobacco: Never  Vaping Use   Vaping Use: Never used  Substance Use Topics   Alcohol use: Yes    Comment: occ   Drug use: Never    Home Medications Prior to Admission medications   Medication Sig Start Date End Date Taking? Authorizing Provider  meclizine (ANTIVERT) 25 MG tablet Take 1 tablet (25 mg total) by mouth 3 (three) times daily as needed for dizziness. 07/15/18   Jacalyn Lefevre, MD    Allergies    Patient has no known allergies.  Review of Systems   Review of Systems  Constitutional:  Negative for chills and fever.  HENT:  Negative for ear pain, nosebleeds and sore throat.   Eyes:  Negative for pain and visual disturbance.  Respiratory:  Negative for cough and shortness of breath.   Cardiovascular:  Negative for chest pain and palpitations.  Gastrointestinal:  Positive for blood in stool and hematochezia. Negative for abdominal distention, abdominal pain, anal bleeding, constipation,  diarrhea, hematemesis, nausea, rectal pain and vomiting.  Genitourinary:  Negative for dysuria and hematuria.  Musculoskeletal:  Negative for arthralgias and back pain.  Skin:  Negative for color change and rash.  Neurological:  Negative for dizziness, seizures, loss of consciousness, syncope and light-headedness.  All other systems reviewed and are negative.  Physical Exam Updated Vital Signs BP 97/60    Pulse 74    Temp 97.9 F (36.6 C) (Oral)    Resp 16    Ht 6\' 2"  (1.88 m)    Wt 122.5 kg    SpO2 98%    BMI 34.67 kg/m   Physical Exam Vitals and nursing note reviewed.  Constitutional:      General: He is not in acute distress.    Appearance: He is well-developed. He is not ill-appearing.  HENT:     Head: Normocephalic and atraumatic.     Nose: Nose normal.     Mouth/Throat:     Mouth: Mucous membranes are moist.  Eyes:     Extraocular Movements: Extraocular movements intact.     Conjunctiva/sclera: Conjunctivae normal.     Pupils: Pupils are equal, round, and reactive to light.  Cardiovascular:     Rate and Rhythm: Normal rate and regular rhythm.     Pulses: Normal pulses.     Heart sounds: Normal heart sounds. No murmur heard. Pulmonary:     Effort: Pulmonary  effort is normal. No respiratory distress.     Breath sounds: Normal breath sounds.  Abdominal:     General: Abdomen is flat. There is no distension.     Palpations: Abdomen is soft.     Tenderness: There is no abdominal tenderness. There is no guarding.  Genitourinary:    Rectum: Guaiac result positive (bright red blood).  Musculoskeletal:        General: No swelling. Normal range of motion.     Cervical back: Normal range of motion and neck supple.  Skin:    General: Skin is warm and dry.     Capillary Refill: Capillary refill takes less than 2 seconds.  Neurological:     General: No focal deficit present.     Mental Status: He is alert.  Psychiatric:        Mood and Affect: Mood normal.    ED Results /  Procedures / Treatments   Labs (all labs ordered are listed, but only abnormal results are displayed) Labs Reviewed  CBC WITH DIFFERENTIAL/PLATELET - Abnormal; Notable for the following components:      Result Value   WBC 3.5 (*)    Hemoglobin 12.5 (*)    HCT 38.6 (*)    All other components within normal limits  COMPREHENSIVE METABOLIC PANEL - Abnormal; Notable for the following components:   Glucose, Bld 160 (*)    Creatinine, Ser 1.45 (*)    Calcium 8.7 (*)    GFR, Estimated 56 (*)    All other components within normal limits  OCCULT BLOOD X 1 CARD TO LAB, STOOL - Abnormal; Notable for the following components:   Fecal Occult Bld POSITIVE (*)    All other components within normal limits  RESP PANEL BY RT-PCR (FLU A&B, COVID) ARPGX2  LIPASE, BLOOD  PROTIME-INR  CBC WITH DIFFERENTIAL/PLATELET  POC OCCULT BLOOD, ED    EKG EKG Interpretation  Date/Time:  Wednesday December 27 2020 09:33:29 EST Ventricular Rate:  93 PR Interval:  142 QRS Duration: 84 QT Interval:  343 QTC Calculation: 427 R Axis:   43 Text Interpretation: Sinus rhythm Abnormal R-wave progression, early transition Baseline wander in lead(s) V4 Confirmed by Lennice Sites ([id number]) on 12/27/2020 9:51:49 AM  Radiology CT ANGIO GI BLEED  Result Date: 12/27/2020 CLINICAL DATA:  Hematochezia, bright red blood per rectum EXAM: CTA ABDOMEN AND PELVIS WITHOUT AND WITH CONTRAST TECHNIQUE: Multidetector CT imaging of the abdomen and pelvis was performed using the standard protocol during bolus administration of intravenous contrast. Multiplanar reconstructed images and MIPs were obtained and reviewed to evaluate the vascular anatomy. CONTRAST:  11mL OMNIPAQUE IOHEXOL 350 MG/ML SOLN COMPARISON:  None. FINDINGS: VASCULAR Normal contour and caliber of the abdominal aorta. No evidence of aneurysm, dissection, or other acute aortic pathology. Standard branching pattern of the abdominal aorta with solitary bilateral renal  arteries. Minimal, scattered atherosclerosis. Review of the MIP images confirms the above findings. NON-VASCULAR Lower chest: No acute abnormality. Hepatobiliary: No focal liver abnormality is seen. No gallstones, gallbladder wall thickening, or biliary dilatation. Pancreas: Unremarkable. No pancreatic ductal dilatation or surrounding inflammatory changes. Spleen: Normal in size without focal abnormality. Adrenals/Urinary Tract: Adrenal glands are unremarkable. Simple, benign bilateral renal cysts. Kidneys are otherwise normal, without renal calculi, focal lesion, or hydronephrosis. Thickening of the urinary bladder, likely secondary to chronic outlet obstruction. Stomach/Bowel: Stomach is within normal limits. Appendix appears normal. No evidence of bowel wall thickening, distention, or inflammatory changes. Descending and sigmoid diverticulosis. The colon is fluid-filled to  the rectum. Lymphatic: No significant vascular findings are present. No enlarged abdominal or pelvic lymph nodes. Reproductive: Uterus and bilateral adnexa are unremarkable. Other: No abdominal wall hernia or abnormality. No abdominopelvic ascites. Musculoskeletal: No fracture is seen. IMPRESSION: 1. No CT findings to explain or localize reported GI bleeding. No intraluminal contrast extravasation. 2. Descending and sigmoid diverticulosis without evidence of acute diverticulitis. 3. The colon is fluid-filled to the rectum, consistent with diarrheal illness. 4. Normal contour and caliber of the abdominal aorta without evidence of aneurysm, dissection, or other acute aortic pathology. Aortic Atherosclerosis (ICD10-I70.0). Electronically Signed   By: Delanna Ahmadi M.D.   On: 12/27/2020 10:36    Procedures Procedures   Medications Ordered in ED Medications  sodium chloride 0.9 % bolus 1,000 mL (0 mLs Intravenous Stopped 12/27/20 1051)  iohexol (OMNIPAQUE) 350 MG/ML injection 100 mL (100 mLs Intravenous Contrast Given 12/27/20 1016)  sodium  chloride 0.9 % bolus 1,000 mL (1,000 mLs Intravenous New Bag/Given 12/27/20 1335)    ED Course  I have reviewed the triage vital signs and the nursing notes.  Pertinent labs & imaging results that were available during my care of the patient were reviewed by me and considered in my medical decision making (see chart for details).    MDM Rules/Calculators/A&P                          Brigido Balandran is a 55 year old male with history of high cholesterol, hypertension who presents the ED with bloody bowel movements.  Patient tachycardic but otherwise normal vitals.  Patient states that he had a hard bowel movement that appeared to have some blood in it and over the course of the last hour he has had 3 fairly large volume bloody bowel movements.  He is not on blood thinners.  He denies any abdominal pain or chest pain.  No history of GI bleeds.  He states he had a colonoscopy when he was 60 that was unremarkable.  Patient has obvious hematochezia on exam.  Concern for diverticular bleed.  Could be polyp.  This seems to be unlikely an upper GI bleed.  Shortly after evaluation patient had vasovagal response while he had another bloody bowel movement.  Was unable to capture what his blood pressure was low patient was mildly unresponsive but this only lasted about a minute.  After the event blood pressure is in the low 100s.  Tachycardia has actually improved.  Talked with Dr. Michail Sermon with gastroenterology who is aware of the patient.  Currently he appears hemodynamically stable.  Hemoglobin is 12.5.  If creatinine allows will get a CT angio to evaluate for possible source of the bleed.    CT angio shows no obvious bleeding.  There is no evidence of diverticulitis.  Colon is fluid-filled to the rectum consistent with diarrheal illness.  However no inflammatory changes were seen.  Overall suspect diverticular bleed versus other source for possible lower GI bleed.  GI team is aware.  Will admit to hospital.  He  appears to be hemodynamically stable.  2:04 PM patient had another small bloody bowel movement but he did vagal again.  He had transient hypotension that resolved fairly quickly.  It has been about 5 hours since his last bloody bowel movement.  He has been hemodynamically stable prior to this.  Repeat CBC is in process.  He was given an additional fluid bolus.  At this time transport is here to bring him to  his hospital bed.  His heart rate has stayed in the 70s.  His mentation is normal.  Blood pressure now in the low 100s.  Overall suspect he is stable to go to floor bed.  We have very minimal blood products here and overall suspect that this was another vasovagal event.  However suspect he will need blood transfusions.  He is stable for transfer to the floor at this time.  This chart was dictated using voice recognition software.  Despite best efforts to proofread,  errors can occur which can change the documentation meaning.     Final Clinical Impression(s) / ED Diagnoses Final diagnoses:  Hematochezia  Gastrointestinal hemorrhage, unspecified gastrointestinal hemorrhage type    Rx / DC Orders ED Discharge Orders     None        Lennice Sites, DO 12/27/20 [place], Ceriah Kohler, DO 12/27/20 [place], [employer], DO 12/27/20 [place], [place], DO 12/27/20 1405

## 2020-12-27 NOTE — ED Notes (Signed)
Report given to floor, ED MD made aware of pt BP, at bedside at this time.

## 2020-12-27 NOTE — ED Notes (Signed)
Pt became unresponsive with stiffening and R eye deviation. MD called to bedside. Pt became responsive for approximately one minute before having second episode. Pt noted to be incontinent of bright red stool during this time. VS changes noted including HR now 75, BP 93/56

## 2020-12-27 NOTE — ED Notes (Signed)
Patient has a vagal response during a BM. Dropped SAT and placed on NRB. RT to monitor

## 2020-12-27 NOTE — ED Notes (Signed)
Pt family member called out from room stating pt was dizzy, upon entering room, pt was passed out with eyes looking up and to left side. Pt diaphoretic, hypotensive. Dr. Lockie Mola at bedside. Passed another bloody BM

## 2020-12-27 NOTE — ED Triage Notes (Signed)
Pt reports having a small amount of bright red blood on toilet paper after having a hard bowel movement this morning. Pt reports 3 episodes of diarrhea since with blood noted. Pt denies CP, SOB, dizziness.

## 2020-12-27 NOTE — Consult Note (Signed)
Referring Provider: Dr. Lockie Mola Primary Care Physician:  Macy Mis, MD Primary Gastroenterologist:  Gentry Fitz  Reason for Consultation:  GI bleed  HPI: Michael Castillo is a 55 y.o. male with the acute onset of painless hematochezia that started this morning with associated lightheadedness. Had 3 large bloody loose stools and came to the ER. Nearly passed out in ER during a bloody BM. Denies abdominal pain, rectal pain, N/V. CT angiogram shows no active bleeding; does show left-sided diverticulosis. Has not had rectal bleeding before. Reports having a colonoscopy with the past 5 years in [facility] and was reportedly told to repeat in 10 years (records not available at this time). Denies NSAIDs. Occasional alcohol. Wife is with him. Hgb 10.8.  Past Medical History:  Diagnosis Date   Hyperlipidemia    Hypertension    Hypothyroidism     Past Surgical History:  Procedure Laterality Date   BACK SURGERY      Prior to Admission medications   Medication Sig Start Date End Date Taking? Authorizing Provider  atorvastatin (LIPITOR) 40 MG tablet Take 40 mg by mouth daily. 12/27/20   [provider]  levothyroxine (SYNTHROID) 112 MCG tablet Take 112 mcg by mouth daily. 12/17/20   [provider]  meclizine (ANTIVERT) 25 MG tablet Take 1 tablet (25 mg total) by mouth 3 (three) times daily as needed for dizziness. 07/15/18   Jacalyn Lefevre, MD    Scheduled Meds:  Chlorhexidine Gluconate Cloth  6 each Topical Daily   sodium chloride flush  3 mL Intravenous Q12H   Continuous Infusions: PRN Meds:.acetaminophen **OR** acetaminophen, ondansetron **OR** ondansetron (ZOFRAN) IV  Allergies as of 12/27/2020   (No Known Allergies)    No family history on file.  Social History   Socioeconomic History   Marital status: Married    Spouse name: Not on file   Number of children: Not on file   Years of education: Not on file   Highest education level: Not on file  Occupational  History   Not on file  Tobacco Use   Smoking status: Every Day    Types: Cigarettes   Smokeless tobacco: Never  Vaping Use   Vaping Use: Never used  Substance and Sexual Activity   Alcohol use: Yes    Comment: occ   Drug use: Never   Sexual activity: Yes  Other Topics Concern   Not on file  Social History Narrative   Not on file   Social Determinants of Health   Financial Resource Strain: Not on file  Food Insecurity: Not on file  Transportation Needs: Not on file  Physical Activity: Not on file  Stress: Not on file  Social Connections: Not on file  Intimate Partner Violence: Not on file    Review of Systems: All negative except as stated above in HPI.  Physical Exam: Vital signs: Vitals:   12/27/20 1600 12/27/20 1700  BP: (!) 94/51 (!) 98/56  Pulse: 78 72  Resp: 13 18  Temp:    SpO2: 100% 100%  T 98  Last BM Date: 12/27/20 General:   Lethargic, diaphoretic, mild acute distress, well-nourished, pleasant  Head: normocephalic, atraumatic Eyes: anicteric sclera ENT: oropharynx clear Neck: supple, nontender Lungs:  Clear throughout to auscultation.   No wheezes, crackles, or rhonchi. No acute distress. Heart:  Regular rate and rhythm; no murmurs, clicks, rubs,  or gallops. Abdomen: soft, nontender, nondistended, +BS  Rectal:  Deferred Ext: no edema  GI:  Lab Results: Recent Labs  12/27/20 0939 12/27/20 1340 12/27/20 1621  WBC 3.5* 7.1 6.7  HGB 12.5* 10.8* 10.8*  HCT 38.6* 32.6* 33.5*  PLT 195 134* 158   BMET Recent Labs    12/27/20 0939  NA 136  K 4.0  CL 105  CO2 23  GLUCOSE 160*  BUN 18  CREATININE 1.45*  CALCIUM 8.7*   LFT Recent Labs    12/27/20 0939  PROT 6.7  ALBUMIN 3.6  AST 25  ALT 21  ALKPHOS 45  BILITOT 0.7   PT/INR Recent Labs    12/27/20 0939  LABPROT 14.3  INR 1.1     Studies/Results: CT ANGIO GI BLEED  Result Date: 12/27/2020 CLINICAL DATA:  Hematochezia, bright red blood per rectum EXAM: CTA ABDOMEN  AND PELVIS WITHOUT AND WITH CONTRAST TECHNIQUE: Multidetector CT imaging of the abdomen and pelvis was performed using the standard protocol during bolus administration of intravenous contrast. Multiplanar reconstructed images and MIPs were obtained and reviewed to evaluate the vascular anatomy. CONTRAST:  OMNIPAQUE IOHEXOL 350 MG/ML SOLN COMPARISON:  None. FINDINGS: VASCULAR Normal contour and caliber of the abdominal aorta. No evidence of aneurysm, dissection, or other acute aortic pathology. Standard branching pattern of the abdominal aorta with solitary bilateral renal arteries. Minimal, scattered atherosclerosis. Review of the MIP images confirms the above findings. NON-VASCULAR Lower chest: No acute abnormality. Hepatobiliary: No focal liver abnormality is seen. No gallstones, gallbladder wall thickening, or biliary dilatation. Pancreas: Unremarkable. No pancreatic ductal dilatation or surrounding inflammatory changes. Spleen: Normal in size without focal abnormality. Adrenals/Urinary Tract: Adrenal glands are unremarkable. Simple, benign bilateral renal cysts. Kidneys are otherwise normal, without renal calculi, focal lesion, or hydronephrosis. Thickening of the urinary bladder, likely secondary to chronic outlet obstruction. Stomach/Bowel: Stomach is within normal limits. Appendix appears normal. No evidence of bowel wall thickening, distention, or inflammatory changes. Descending and sigmoid diverticulosis. The colon is fluid-filled to the rectum. Lymphatic: No significant vascular findings are present. No enlarged abdominal or pelvic lymph nodes. Reproductive: Uterus and bilateral adnexa are unremarkable. Other: No abdominal wall hernia or abnormality. No abdominopelvic ascites. Musculoskeletal: No fracture is seen. IMPRESSION: 1. No CT findings to explain or localize reported GI bleeding. No intraluminal contrast extravasation. 2. Descending and sigmoid diverticulosis without evidence of acute  diverticulitis. 3. The colon is fluid-filled to the rectum, consistent with diarrheal illness. 4. Normal contour and caliber of the abdominal aorta without evidence of aneurysm, dissection, or other acute aortic pathology. Aortic Atherosclerosis (ICD10-I70.0). Electronically Signed   By: Jearld Lesch M.D.   On: 12/27/2020 10:36    Impression/Plan: Painless hematochezia that is likely a diverticular source. Supportive care. Follow H/Hs closely. With recent colonoscopy I do not think a repeat colonoscopy is needed at this time unless bleeding persists over the next 24-36 hours. Obtain last colonoscopy report. Clear liquid diet. D/W Dr. Ronaldo Miyamoto. Will follow.    LOS: 0 days   Shirley Friar  12/27/2020, 5:24 PM  Questions please call [phone]

## 2020-12-27 NOTE — Progress Notes (Signed)
Notified by EDP of need for admission d/t GIB w/ syncope. [facility] accepts patient to SDU at [facility]. EDP is to remain responsible for orders/medical decisions while patient is holding at [facility]. Upon arrival to [facility], [facility] will assume care. Nursing staff will call patient placement to notify them of patient's arrival so that the proper [facility] member may receive the patient. Nursing staff will notify the following consultants, [facility], of patient's arrival for their evaluation. Thank you.

## 2020-12-28 ENCOUNTER — Observation Stay (HOSPITAL_BASED_OUTPATIENT_CLINIC_OR_DEPARTMENT_OTHER): Payer: Federal, State, Local not specified - PPO

## 2020-12-28 ENCOUNTER — Encounter (HOSPITAL_COMMUNITY): Payer: Self-pay | Admitting: Internal Medicine

## 2020-12-28 DIAGNOSIS — K5790 Diverticulosis of intestine, part unspecified, without perforation or abscess without bleeding: Secondary | ICD-10-CM

## 2020-12-28 DIAGNOSIS — E669 Obesity, unspecified: Secondary | ICD-10-CM | POA: Diagnosis not present

## 2020-12-28 DIAGNOSIS — N179 Acute kidney failure, unspecified: Secondary | ICD-10-CM

## 2020-12-28 DIAGNOSIS — R55 Syncope and collapse: Secondary | ICD-10-CM | POA: Diagnosis not present

## 2020-12-28 DIAGNOSIS — E785 Hyperlipidemia, unspecified: Secondary | ICD-10-CM

## 2020-12-28 DIAGNOSIS — K922 Gastrointestinal hemorrhage, unspecified: Secondary | ICD-10-CM | POA: Diagnosis not present

## 2020-12-28 LAB — ECHOCARDIOGRAM COMPLETE
Area-P 1/2: 4.88 cm2
Calc EF: 65.2 %
Height: 74 in
S' Lateral: 2.7 cm
Single Plane A2C EF: 67.8 %
Single Plane A4C EF: 62.3 %
Weight: 4338.65 oz

## 2020-12-28 LAB — COMPREHENSIVE METABOLIC PANEL
ALT: 19 U/L (ref 0–44)
AST: 20 U/L (ref 15–41)
Albumin: 3.5 g/dL (ref 3.5–5.0)
Alkaline Phosphatase: 38 U/L (ref 38–126)
Anion gap: 4 — ABNORMAL LOW (ref 5–15)
BUN: 20 mg/dL (ref 6–20)
CO2: 24 mmol/L (ref 22–32)
Calcium: 8.2 mg/dL — ABNORMAL LOW (ref 8.9–10.3)
Chloride: 107 mmol/L (ref 98–111)
Creatinine, Ser: 1.29 mg/dL — ABNORMAL HIGH (ref 0.61–1.24)
GFR, Estimated: >60 mL/min (ref >60)
Glucose, Bld: 106 mg/dL — ABNORMAL HIGH (ref 70–99)
Potassium: 3.8 mmol/L (ref 3.5–5.1)
Sodium: 135 mmol/L (ref 135–145)
Total Bilirubin: 0.9 mg/dL (ref 0.3–1.2)
Total Protein: 6 g/dL — ABNORMAL LOW (ref 6.5–8.1)

## 2020-12-28 LAB — CBC
HCT: 31 % — ABNORMAL LOW (ref 39.0–52.0)
Hemoglobin: 9.9 g/dL — ABNORMAL LOW (ref 13.0–17.0)
MCH: 29.4 pg (ref 26.0–34.0)
MCHC: 31.9 g/dL (ref 30.0–36.0)
MCV: 92 fL (ref 80.0–100.0)
Platelets: 155 10*3/uL (ref 150–400)
RBC: 3.37 MIL/uL — ABNORMAL LOW (ref 4.22–5.81)
RDW: 14.2 % (ref 11.5–15.5)
WBC: 5.4 10*3/uL (ref 4.0–10.5)
nRBC: 0 % (ref 0.0–0.2)

## 2020-12-28 LAB — GLUCOSE, CAPILLARY: Glucose-Capillary: 96 mg/dL (ref 70–99)

## 2020-12-28 LAB — ABO/RH: ABO/RH(D): O POS

## 2020-12-28 LAB — HIV ANTIBODY (ROUTINE TESTING W REFLEX): HIV Screen 4th Generation wRfx: NONREACTIVE

## 2020-12-28 NOTE — Progress Notes (Signed)
Patient having bowel movements with bright red and dark blood, will cancel discharge and will check Hgb in am, if stable will plan to discharge home.  Ok to transfer to telemetry

## 2020-12-28 NOTE — Progress Notes (Signed)
[facility] Progress Note  Michael Castillo 55 y.o. 1965/01/31  CC:  GI bleed   Subjective: Patient is sitting comfortably in hospital bed with wife at bedside. Patient states he has not had any further rectal bleeding and has not had any further Bms as well. Reports increased flatus. Denies abdominal pain, nausea, vomiting.  ROS : Review of Systems  Gastrointestinal:  Negative for abdominal pain, blood in stool, constipation, diarrhea, heartburn, melena, nausea and vomiting.  Genitourinary:  Negative for dysuria and urgency.     Objective: Vital signs in last 24 hours: Vitals:   12/28/20 0500 12/28/20 0812  BP: 103/69   Pulse: 72   Resp: (!) 7   Temp:  98.1 F (36.7 C)  SpO2: 97%     Physical Exam:  General:  Alert, cooperative, no distress, appears stated age  Head:  Normocephalic, without obvious abnormality, atraumatic  Eyes:  Anicteric sclera, EOM's intact  Lungs:   Clear to auscultation bilaterally, respirations unlabored  Heart:  Regular rate and rhythm, S1, S2 normal  Abdomen:   Soft, non-tender, bowel sounds active all four quadrants,  no masses,     Lab Results: Recent Labs    12/27/20 0939 12/28/20 0307  NA 136 135  K 4.0 3.8  CL 105 107  CO2 23 24  GLUCOSE 160* 106*  BUN 18 20  CREATININE 1.45* 1.29*  CALCIUM 8.7* 8.2*   Recent Labs    12/27/20 0939 12/28/20 0307  AST 25 20  ALT 21 19  ALKPHOS 45 38  BILITOT 0.7 0.9  PROT 6.7 6.0*  ALBUMIN 3.6 3.5   Recent Labs    12/27/20 1340 12/27/20 1621 12/28/20 0307  WBC 7.1 6.7 5.4  NEUTROABS 5.4 5.4  --   HGB 10.8* 10.8* 9.9*  HCT 32.6* 33.5* 31.0*  MCV 88.8 89.8 92.0  PLT 134* 158 155   Recent Labs    12/27/20 0939  LABPROT 14.3  INR 1.1      Assessment GI bleed; possibly secondary to diverticular bleed - hgb 9.9 (decreased from 10.8 yesterday), MCV normal - BUN 20, 1.29 - no leukocytosis - CTA 12/21: descending and sigmoid diverticulosis without acute diverticulitis.  No active bleeding seen. - Last colonoscopy 5 years ago (with repeat in 10 years)  Plan: No further rectal bleeding at this time with mild decrease in hgb. Though hgb is stable. Continue daily CBC and transfuse as needed to maintain HGB > 7  Continue to hold off on procedure at this time, since no further rectal bleeding. Continue supportive care [facility] will follow  Legrand Como PA-C 12/28/2020, 9:09 AM  Contact #  [phone]

## 2020-12-28 NOTE — Discharge Summary (Signed)
Physician Discharge Summary  Michael Castillo [id number] DOB: 01/26/1965 DOA: 12/27/2020  PCP: Michael Mires, MD  Admit date: 12/27/2020 Discharge date: 12/28/2020  Admitted From: Home  Disposition:   Home   Recommendations for Outpatient Follow-up and new medication changes:  Follow up with Dr. Maudie Castillo in 7 to 10 days  Follow up renal function as outpatient.  Home Health: na   Equipment/Devices: na    Discharge Condition: stable  CODE STATUS: full  Diet recommendation:  heart healthy   Brief/Interim Summary: Mr. Michael Castillo was admitted to the hospital with the working diagnosis of lower GI bleed due to diverticular bleed.   55 yo male with the past medical history of dyslipidemia, hypothyroid, and HTN who presented with hematochezia. On the day of hospitalization patient had frequent bloody bowel movements with no abdominal pain, no nausea or vomiting. Because of persistent bleeding he came to the hospital for further evaluation. On his initial physical examination his blood pressure was 97/60, 102/60, HR 71, RR 16, temp 97.0, oxygen saturation 100%, lungs were clear to auscultation, heart with S1 and S2 present, abdomen soft and non tender, no lower extremity edema.    Sodium 136, potassium 4.0, chloride 105, bicarb 23, glucose 160, BUN 18, creatinine 1.45, white count 3.5, hemoglobin 12.5, hematocrit 38.6, platelets 195. SARS COVID-19 negative.   Abdominal CT angiography with no findings explain or localize reported gastrointestinal bleeding.  Descending and sigmoid diverticulosis without diverticulitis. Normal contour and caliber of the abdominal aorta without evidence of aneurysm, dissection or other acute aortic pathology.   EKG 93 bpm, normal axis, normal intervals, sinus rhythm, no significant ST segment or T wave changes.  Patient had a syncope episode while moving his bowels on the commode, had hypotension and poorly responsive, symptoms improved with volume repletion with IV  fluids.   Patient with no further bleeding, follow up blood pressure negative for orthostatics. His cell count has been stable. Plan to follow up as outpatient.   Acute lower GI bleed due to diverticular bleed.  Patient was admitted to the progressive care unit, he had close hemodynamic monitoring, received intravenous fluids. He had no further active bleeding.  His follow-up hemoglobin is 9.9 with hematocrit of 31.0.  Platelets 155. Patient was seen by gastroenterology, likely diverticular bleed, he had a recent colonoscopy.  No indication for new endoscopic procedure at this time. Plan to follow-up as an outpatient.  His diet has been advanced with good toleration.  2.  Hypovolemic hypotension, vasovagal complement.  Patient had a near syncope episode, no frank loss of consciousness related to hypotensive episode. He received volume repletion with intravenous fluids with improvement of hemodynamics. After appropriate volume resuscitation his orthostatic vital signs were negative.  3.  Acute kidney injury, prerenal, CKD stage 3a. His creatinine went up to 1.45, at discharge she is down to 1.29, sodium 135, potassium 3.8, chloride 107 and bicarb 24.  Patient is tolerating p.o. diet adequately. Follow-up kidney function as an outpatient.  4.  Obesity class I, dyslipidemia.  Calculated BMI 34.8, continue with statin therapy.  5.  Hypothyroidism.  Continue with levothyroxine   Discharge Diagnoses:  Principal Problem:   GIB (gastrointestinal bleeding) Active Problems:   Dyslipidemia   Diverticulosis   AKI (acute kidney injury) ([place])   Class 1 obesity  No diagnosis of hypertension  Discharge Instructions  Discharge Instructions     Diet - low sodium heart healthy   Complete by: As directed    Discharge instructions  Complete by: As directed    Please follow up with primary care in 7 to 10 days   Increase activity slowly   Complete by: As directed       Allergies as of  12/28/2020   No Known Allergies      Medication List     STOP taking these medications    meclizine 25 MG tablet Commonly known as: ANTIVERT       TAKE these medications    atorvastatin 40 MG tablet Commonly known as: LIPITOR Take 40 mg by mouth daily.   levothyroxine 112 MCG tablet Commonly known as: SYNTHROID Take 112 mcg by mouth daily.        No Known Allergies  Consultations: GI   Procedures/Studies: CT ANGIO GI BLEED  Result Date: 12/27/2020 CLINICAL DATA:  Hematochezia, bright red blood per rectum EXAM: CTA ABDOMEN AND PELVIS WITHOUT AND WITH CONTRAST TECHNIQUE: Multidetector CT imaging of the abdomen and pelvis was performed using the standard protocol during bolus administration of intravenous contrast. Multiplanar reconstructed images and MIPs were obtained and reviewed to evaluate the vascular anatomy. CONTRAST:  118mL OMNIPAQUE IOHEXOL 350 MG/ML SOLN COMPARISON:  None. FINDINGS: VASCULAR Normal contour and caliber of the abdominal aorta. No evidence of aneurysm, dissection, or other acute aortic pathology. Standard branching pattern of the abdominal aorta with solitary bilateral renal arteries. Minimal, scattered atherosclerosis. Review of the MIP images confirms the above findings. NON-VASCULAR Lower chest: No acute abnormality. Hepatobiliary: No focal liver abnormality is seen. No gallstones, gallbladder wall thickening, or biliary dilatation. Pancreas: Unremarkable. No pancreatic ductal dilatation or surrounding inflammatory changes. Spleen: Normal in size without focal abnormality. Adrenals/Urinary Tract: Adrenal glands are unremarkable. Simple, benign bilateral renal cysts. Kidneys are otherwise normal, without renal calculi, focal lesion, or hydronephrosis. Thickening of the urinary bladder, likely secondary to chronic outlet obstruction. Stomach/Bowel: Stomach is within normal limits. Appendix appears normal. No evidence of bowel wall thickening, distention,  or inflammatory changes. Descending and sigmoid diverticulosis. The colon is fluid-filled to the rectum. Lymphatic: No significant vascular findings are present. No enlarged abdominal or pelvic lymph nodes. Reproductive: Uterus and bilateral adnexa are unremarkable. Other: No abdominal wall hernia or abnormality. No abdominopelvic ascites. Musculoskeletal: No fracture is seen. IMPRESSION: 1. No CT findings to explain or localize reported GI bleeding. No intraluminal contrast extravasation. 2. Descending and sigmoid diverticulosis without evidence of acute diverticulitis. 3. The colon is fluid-filled to the rectum, consistent with diarrheal illness. 4. Normal contour and caliber of the abdominal aorta without evidence of aneurysm, dissection, or other acute aortic pathology. Aortic Atherosclerosis (ICD10-I70.0). Electronically Signed   By: Delanna Ahmadi M.D.   On: 12/27/2020 10:36     Subjective: Patient is feeling better, no further bleeding, no nausea or vomiting and tolerating po well.  No orthostatic symptoms   Discharge Exam: Vitals:   12/28/20 1100 12/28/20 1134  BP: 126/71   Pulse: 83 80  Resp: 12 13  Temp:    SpO2: 98% 100%   Vitals:   12/28/20 0900 12/28/20 1000 12/28/20 1100 12/28/20 1134  BP: 113/72 128/65 126/71   Pulse: 77 73 83 80  Resp: 13 15 12 13   Temp:      TempSrc:      SpO2: 98% 99% 98% 100%  Weight:      Height:        General: Not in pain or dyspnea   Neurology: Awake and alert, non focal  E ENT: no pallor, no icterus, oral mucosa moist  Cardiovascular: No JVD. S1-S2 present, rhythmic, no gallops, rubs, or murmurs. No lower extremity edema. Pulmonary: positive breath sounds bilaterally, with no wheezing, rhonchi or rales. Gastrointestinal. Abdomen soft and non tender Skin. No rashes Musculoskeletal: no joint deformities   The results of significant diagnostics from this hospitalization (including imaging, microbiology, ancillary and laboratory) are listed  below for reference.     Microbiology: Recent Results (from the past 240 hour(s))  Resp Panel by RT-PCR (Flu A&B, Covid) Nasopharyngeal Swab     Status: None   Collection Time: 12/27/20  9:40 AM   Specimen: Nasopharyngeal Swab; Nasopharyngeal(NP) swabs in vial transport medium  Result Value Ref Range Status   SARS Coronavirus 2 by RT PCR NEGATIVE NEGATIVE Final    Comment: (NOTE) SARS-CoV-2 target nucleic acids are NOT DETECTED.  The SARS-CoV-2 RNA is generally detectable in upper respiratory specimens during the acute phase of infection. The lowest concentration of SARS-CoV-2 viral copies this assay can detect is 138 copies/mL. A negative result does not preclude SARS-Cov-2 infection and should not be used as the sole basis for treatment or other patient management decisions. A negative result may occur with  improper specimen collection/handling, submission of specimen other than nasopharyngeal swab, presence of viral mutation(s) within the areas targeted by this assay, and inadequate number of viral copies(<138 copies/mL). A negative result must be combined with clinical observations, patient history, and epidemiological information. The expected result is Negative.  Fact Sheet for Patients:  EntrepreneurPulse.com.au  Fact Sheet for Healthcare Providers:  IncredibleEmployment.be  This test is no t yet approved or cleared by the [place] FDA and  has been authorized for detection and/or diagnosis of SARS-CoV-2 by FDA under an Emergency Use Authorization (EUA). This EUA will remain  in effect (meaning this test can be used) for the duration of the COVID-19 declaration under Section 564(b)(1) of the Act, 21 U.S.C.section 360bbb-3(b)(1), unless the authorization is terminated  or revoked sooner.       Influenza A by PCR NEGATIVE NEGATIVE Final   Influenza B by PCR NEGATIVE NEGATIVE Final    Comment: (NOTE) The Xpert Xpress  SARS-CoV-2/FLU/RSV plus assay is intended as an aid in the diagnosis of influenza from Nasopharyngeal swab specimens and should not be used as a sole basis for treatment. Nasal washings and aspirates are unacceptable for Xpert Xpress SARS-CoV-2/FLU/RSV testing.  Fact Sheet for Patients: EntrepreneurPulse.com.au  Fact Sheet for Healthcare Providers: IncredibleEmployment.be  This test is not yet approved or cleared by the [place] FDA and has been authorized for detection and/or diagnosis of SARS-CoV-2 by FDA under an Emergency Use Authorization (EUA). This EUA will remain in effect (meaning this test can be used) for the duration of the COVID-19 declaration under Section 564(b)(1) of the Act, 21 U.S.C. section 360bbb-3(b)(1), unless the authorization is terminated or revoked.  Performed at [facility], [address] [place], [place] [address]   MRSA Next Gen by PCR, Nasal     Status: None   Collection Time: 12/27/20  2:54 PM   Specimen: Nasal Mucosa; Nasal Swab  Result Value Ref Range Status   MRSA by PCR Next Gen NOT DETECTED NOT DETECTED Final    Comment: (NOTE) The GeneXpert MRSA Assay (FDA approved for NASAL specimens only), is one component of a comprehensive MRSA colonization surveillance program. It is not intended to diagnose MRSA infection nor to guide or monitor treatment for MRSA infections. Test performance is not FDA approved in patients less than 2 years  old. Performed at [facility], [address], [place], [place] [address]      Labs: BNP (last 3 results) No results for input(s): BNP in the last 8760 hours. Basic Metabolic Panel: Recent Labs  Lab 12/27/20 0939 12/28/20 0307  NA 136 135  K 4.0 3.8  CL 105 107  CO2 23 24  GLUCOSE 160* 106*  BUN 18 20  CREATININE 1.45* 1.29*  CALCIUM 8.7* 8.2*   Liver Function Tests: Recent Labs  Lab 12/27/20 0939 12/28/20 0307  AST 25  20  ALT 21 19  ALKPHOS 45 38  BILITOT 0.7 0.9  PROT 6.7 6.0*  ALBUMIN 3.6 3.5   Recent Labs  Lab 12/27/20 0939  LIPASE 28   No results for input(s): AMMONIA in the last 168 hours. CBC: Recent Labs  Lab 12/27/20 0939 12/27/20 1340 12/27/20 1621 12/28/20 0307  WBC 3.5* 7.1 6.7 5.4  NEUTROABS 1.7 5.4 5.4  --   HGB 12.5* 10.8* 10.8* 9.9*  HCT 38.6* 32.6* 33.5* 31.0*  MCV 88.9 88.8 89.8 92.0  PLT 195 134* 158 155   Cardiac Enzymes: No results for input(s): CKTOTAL, CKMB, CKMBINDEX, TROPONINI in the last 168 hours. BNP: Invalid input(s): POCBNP CBG: Recent Labs  Lab 12/28/20 0504  GLUCAP 96   D-Dimer No results for input(s): DDIMER in the last 72 hours. Hgb A1c No results for input(s): HGBA1C in the last 72 hours. Lipid Profile No results for input(s): CHOL, HDL, LDLCALC, TRIG, CHOLHDL, LDLDIRECT in the last 72 hours. Thyroid function studies No results for input(s): TSH, T4TOTAL, T3FREE, THYROIDAB in the last 72 hours.  Invalid input(s): FREET3 Anemia work up Recent Labs    12/27/20 1543  TIBC 289  IRON 85   Urinalysis    Component Value Date/Time   COLORURINE YELLOW 07/15/2018 1057   APPEARANCEUR CLEAR 07/15/2018 1057   LABSPEC 1.020 07/15/2018 1057   PHURINE 6.5 07/15/2018 1057   GLUCOSEU NEGATIVE 07/15/2018 1057   HGBUR NEGATIVE 07/15/2018 1057   BILIRUBINUR NEGATIVE 07/15/2018 1057   KETONESUR NEGATIVE 07/15/2018 1057   PROTEINUR NEGATIVE 07/15/2018 1057   NITRITE NEGATIVE 07/15/2018 1057   LEUKOCYTESUR NEGATIVE 07/15/2018 1057   Sepsis Labs Invalid input(s): PROCALCITONIN,  WBC,  LACTICIDVEN Microbiology Recent Results (from the past 240 hour(s))  Resp Panel by RT-PCR (Flu A&B, Covid) Nasopharyngeal Swab     Status: None   Collection Time: 12/27/20  9:40 AM   Specimen: Nasopharyngeal Swab; Nasopharyngeal(NP) swabs in vial transport medium  Result Value Ref Range Status   SARS Coronavirus 2 by RT PCR NEGATIVE NEGATIVE Final    Comment:  (NOTE) SARS-CoV-2 target nucleic acids are NOT DETECTED.  The SARS-CoV-2 RNA is generally detectable in upper respiratory specimens during the acute phase of infection. The lowest concentration of SARS-CoV-2 viral copies this assay can detect is 138 copies/mL. A negative result does not preclude SARS-Cov-2 infection and should not be used as the sole basis for treatment or other patient management decisions. A negative result may occur with  improper specimen collection/handling, submission of specimen other than nasopharyngeal swab, presence of viral mutation(s) within the areas targeted by this assay, and inadequate number of viral copies(<138 copies/mL). A negative result must be combined with clinical observations, patient history, and epidemiological information. The expected result is Negative.  Fact Sheet for Patients:  BloggerCourse.com  Fact Sheet for Healthcare Providers:  SeriousBroker.it  This test is no t yet approved or cleared by the [place] FDA and  has been  authorized for detection and/or diagnosis of SARS-CoV-2 by FDA under an Emergency Use Authorization (EUA). This EUA will remain  in effect (meaning this test can be used) for the duration of the COVID-19 declaration under Section 564(b)(1) of the Act, 21 U.S.C.section 360bbb-3(b)(1), unless the authorization is terminated  or revoked sooner.       Influenza A by PCR NEGATIVE NEGATIVE Final   Influenza B by PCR NEGATIVE NEGATIVE Final    Comment: (NOTE) The Xpert Xpress SARS-CoV-2/FLU/RSV plus assay is intended as an aid in the diagnosis of influenza from Nasopharyngeal swab specimens and should not be used as a sole basis for treatment. Nasal washings and aspirates are unacceptable for Xpert Xpress SARS-CoV-2/FLU/RSV testing.  Fact Sheet for Patients: EntrepreneurPulse.com.au  Fact Sheet for Healthcare  Providers: IncredibleEmployment.be  This test is not yet approved or cleared by the [place] FDA and has been authorized for detection and/or diagnosis of SARS-CoV-2 by FDA under an Emergency Use Authorization (EUA). This EUA will remain in effect (meaning this test can be used) for the duration of the COVID-19 declaration under Section 564(b)(1) of the Act, 21 U.S.C. section 360bbb-3(b)(1), unless the authorization is terminated or revoked.  Performed at [facility], [address] [place], [place] [address]   MRSA Next Gen by PCR, Nasal     Status: None   Collection Time: 12/27/20  2:54 PM   Specimen: Nasal Mucosa; Nasal Swab  Result Value Ref Range Status   MRSA by PCR Next Gen NOT DETECTED NOT DETECTED Final    Comment: (NOTE) The GeneXpert MRSA Assay (FDA approved for NASAL specimens only), is one component of a comprehensive MRSA colonization surveillance program. It is not intended to diagnose MRSA infection nor to guide or monitor treatment for MRSA infections. Test performance is not FDA approved in patients less than 39 years old. Performed at [facility], [place] [address] [place], [place] [address]      Time coordinating discharge: 45 minutes  SIGNED:   Tawni Millers, MD  [facility] 12/28/2020, 12:10 PM

## 2020-12-28 NOTE — Plan of Care (Signed)

## 2020-12-28 NOTE — Progress Notes (Signed)
°  Echocardiogram 2D Echocardiogram has been performed.  Janalyn Harder 12/28/2020, 1:59 PM

## 2020-12-29 DIAGNOSIS — K922 Gastrointestinal hemorrhage, unspecified: Secondary | ICD-10-CM | POA: Diagnosis not present

## 2020-12-29 LAB — HEMOGLOBIN AND HEMATOCRIT, BLOOD
HCT: 27.2 % — ABNORMAL LOW (ref 39.0–52.0)
Hemoglobin: 8.7 g/dL — ABNORMAL LOW (ref 13.0–17.0)

## 2020-12-29 LAB — GLUCOSE, CAPILLARY: Glucose-Capillary: 102 mg/dL — ABNORMAL HIGH (ref 70–99)

## 2020-12-29 NOTE — Progress Notes (Signed)
Patient ID: Michael Castillo, male   DOB: 08/19/1965, 55 y.o.   MRN: [id number]  Bleeding yesterday noted. Hgb 8.7. No bleeding overnight. Feels good.  Agree with discharge today as planned and he should f/u with his primary GI.

## 2020-12-29 NOTE — Progress Notes (Signed)
This RN provided discharge teaching to patient and wife Kaeo Jacome). All questions answered. Pt verbalized understanding of discharge plan and verbalized understanding to seek medical attention if bleeding returned. PIVs removed. Patient transferred to main lobby with this RN via wheelchair. Patient transferred from wheelchair into car safely.

## 2020-12-29 NOTE — Progress Notes (Signed)
Discharge was cancelled yesterday due to dark stools.  Overnight and this am with no further dark stool or hematochezia.   His vital signs are stable with blood pressure 135/72, HR 86, RR12 and oxygen saturation 98% on room air.  Physical examination with no change.  Diverticular bleed.  Follow up hgb this am is 8,7 and hct 27.2 Plan to discharge home and follow up as outpatient.

## 2021-02-21 ENCOUNTER — Other Ambulatory Visit: Payer: Self-pay | Admitting: Diagnostic Radiology

## 2021-02-21 DIAGNOSIS — I2699 Other pulmonary embolism without acute cor pulmonale: Secondary | ICD-10-CM

## 2021-03-09 ENCOUNTER — Encounter: Payer: Self-pay | Admitting: *Deleted

## 2021-03-09 ENCOUNTER — Other Ambulatory Visit: Payer: Self-pay

## 2021-03-09 ENCOUNTER — Ambulatory Visit
Admission: RE | Admit: 2021-03-09 | Discharge: 2021-03-09 | Disposition: A | Discharge status: Home / Self Care | Payer: Federal, State, Local not specified - PPO | Source: Ambulatory Visit | Attending: Diagnostic Radiology | Admitting: Diagnostic Radiology

## 2021-03-09 DIAGNOSIS — I2699 Other pulmonary embolism without acute cor pulmonale: Secondary | ICD-10-CM

## 2021-03-09 HISTORY — PX: IR RADIOLOGIST EVAL & MGMT: IMG5224

## 2021-03-09 NOTE — Progress Notes (Signed)
Chief Complaint: Patient was seen in consultation today for IVC filter follow-up   History of Present Illness: Michael Castillo is a 56 y.o. male with history of GI bleeding, DVT and pulmonary embolism.  Patient presented to [facility] on 12/29/2020 with hematochezia.  Patient was admitted to the hospital and the GI bleeding was managed.  Source of GI bleeding was never clearly identified.  Patient was noted to have left peroneal vein thrombus and bilateral pulmonary emboli.  Due to the GI bleeding, the patient could not have anticoagulation and IVC filter was placed on 01/05/2021.  Patient was discharged on 01/10/2021.  Patient reports no additional GI bleeding since he was discharged.  The patient has seen hematology and he is now on Eliquis.  Patient is asymptomatic.  Patient is here to discuss management of the IVC filter.  Patient had a lower extremity venous duplex today and there is no evidence for DVT.  Past Medical History:  Diagnosis Date   Hyperlipidemia    Hypertension    Hypothyroidism     Past Surgical History:  Procedure Laterality Date   BACK SURGERY     IR RADIOLOGIST EVAL & MGMT  03/09/2021    Allergies: Patient has no known allergies.  Medications: Prior to Admission medications   Medication Sig Start Date End Date Taking? Authorizing Provider  atorvastatin (LIPITOR) 40 MG tablet Take 40 mg by mouth daily. 12/27/20   [provider]  levothyroxine (SYNTHROID) 112 MCG tablet Take 112 mcg by mouth daily. 12/17/20   [provider]     No family history on file.  Social History   Socioeconomic History   Marital status: Married    Spouse name: Not on file   Number of children: Not on file   Years of education: Not on file   Highest education level: Not on file  Occupational History   Not on file  Tobacco Use   Smoking status: Every Day    Types: Cigarettes   Smokeless tobacco: Never  Vaping Use   Vaping Use: Never used   Substance and Sexual Activity   Alcohol use: Yes    Comment: occ   Drug use: Never   Sexual activity: Yes  Other Topics Concern   Not on file  Social History Narrative   Not on file   Social Determinants of Health   Financial Resource Strain: Not on file  Food Insecurity: Not on file  Transportation Needs: Not on file  Physical Activity: Not on file  Stress: Not on file  Social Connections: Not on file    ECOG Status: 0 - Asymptomatic   Review of Systems  Gastrointestinal: Negative.  Negative for blood in stool.   Vital Signs: There were no vitals taken for this visit.  Physical Exam Constitutional:      Appearance: Normal appearance. He is not ill-appearing.  Pulmonary:     Effort: Pulmonary effort is normal.  Neurological:     Mental Status: He is alert.        Imaging: US Venous Img Lower Bilateral (DVT)  Result Date: 03/09/2021 CLINICAL DATA:  Pulmonary embolism EXAM: BILATERAL LOWER EXTREMITY VENOUS DOPPLER ULTRASOUND TECHNIQUE: Gray-scale sonography with graded compression, as well as color Doppler and duplex ultrasound were performed to evaluate the lower extremity deep venous systems from the level of the common femoral vein and including the common femoral, femoral, profunda femoral, popliteal and calf veins including the posterior tibial, peroneal and gastrocnemius veins when  visible. The superficial great saphenous vein was also interrogated. Spectral Doppler was utilized to evaluate flow at rest and with distal augmentation maneuvers in the common femoral, femoral and popliteal veins. COMPARISON:  None. FINDINGS: RIGHT LOWER EXTREMITY Common Femoral Vein: No evidence of thrombus. Normal compressibility, respiratory phasicity and response to augmentation. Saphenofemoral Junction: No evidence of thrombus. Normal compressibility and flow on color Doppler imaging. Profunda Femoral Vein: No evidence of thrombus. Normal compressibility and flow on color Doppler  imaging. Femoral Vein: No evidence of thrombus. Normal compressibility, respiratory phasicity and response to augmentation. Popliteal Vein: No evidence of thrombus. Normal compressibility, respiratory phasicity and response to augmentation. Calf Veins: No evidence of thrombus. Normal compressibility and flow on color Doppler imaging. Superficial Great Saphenous Vein: No evidence of thrombus. Normal compressibility. Venous Reflux:  None. Other Findings:  None. LEFT LOWER EXTREMITY Common Femoral Vein: No evidence of thrombus. Normal compressibility, respiratory phasicity and response to augmentation. Saphenofemoral Junction: No evidence of thrombus. Normal compressibility and flow on color Doppler imaging. Profunda Femoral Vein: No evidence of thrombus. Normal compressibility and flow on color Doppler imaging. Femoral Vein: No evidence of thrombus. Normal compressibility, respiratory phasicity and response to augmentation. Popliteal Vein: No evidence of thrombus. Normal compressibility, respiratory phasicity and response to augmentation. Calf Veins: No evidence of thrombus. Normal compressibility and flow on color Doppler imaging. Superficial Great Saphenous Vein: No evidence of thrombus. Normal compressibility. Venous Reflux:  None. Other Findings: There is limited evaluation of peroneal veins in both calves. IMPRESSION: No evidence of deep venous thrombosis in either lower extremity. Electronically Signed   By: Elmer Picker M.D.   On: 03/09/2021 14:06   IR Radiologist Eval & Mgmt  Result Date: 03/09/2021 Please refer to notes tab for details about interventional procedure. (Op Note)   Labs:  CBC: Recent Labs    12/27/20 0939 12/27/20 1340 12/27/20 1621 12/28/20 0307 12/29/20 0317  WBC 3.5* 7.1 6.7 5.4  --   HGB 12.5* 10.8* 10.8* 9.9* 8.7*  HCT 38.6* 32.6* 33.5* 31.0* 27.2*  PLT 195 134* 158 155  --     COAGS: Recent Labs    12/27/20 0939  INR 1.1    BMP: Recent Labs     12/27/20 0939 12/28/20 0307  NA 136 135  K 4.0 3.8  CL 105 107  CO2 23 24  GLUCOSE 160* 106*  BUN 18 20  CALCIUM 8.7* 8.2*  CREATININE 1.45* 1.29*  GFRNONAA 56* >60    LIVER FUNCTION TESTS: Recent Labs    12/27/20 0939 12/28/20 0307  BILITOT 0.7 0.9  AST 25 20  ALT 21 19  ALKPHOS 45 38  PROT 6.7 6.0*  ALBUMIN 3.6 3.5    TUMOR MARKERS: No results for input(s): AFPTM, CEA, CA199, CHROMGRNA in the last 8760 hours.  Assessment and Plan:  56 year old with a history of left lower extremity DVT, pulmonary embolism and GI bleed.  IVC filter was placed at the time of the GI bleeding because he could not be anticoagulated.  Patient is no longer having GI bleeding and he has been taking Eliquis without complication.  We discussed IVC filter management.  Recommend removing the filter now that he is anticoagulated because there is always a concern for filter complications such as fracture, migration and thrombosis.  We reviewed the patient's prior imaging.  We discussed the filter retrieval procedure in detail including the use of moderate sedation and how the patient can stay on anticoagulation for the procedure.  We discussed risks  of the retrieval which include unsuccessful retrieval, vascular injury and filter damage.  Based on the patient's anatomy, I believe there is a high chance of success for filter retrieval without complication.  I believe the patient and his wife have a very good understanding of the procedure.  He is scheduled to follow-up with hematology later in the month but he informed me that the hematologist recommended having the filter removed when possible.  Therefore, we will schedule the patient for IVC filter retrieval and the patient would like to have the procedure performed at [facility].  Thank you for this interesting consult.  I greatly enjoyed meeting Michael Castillo and look forward to participating in their care.  A copy of this report was sent to  the requesting provider on this date.  Electronically Signed: Burman Riis 03/09/2021, 3:08 PM   I spent a total of    15 Minutes in face to face in clinical consultation, greater than 50% of which was counseling/coordinating care for IVC filter management.  Patient ID: Michael Castillo, male   DOB: 11-12-1965, 56 y.o.   MRN: [id number]

## 2021-04-07 ENCOUNTER — Other Ambulatory Visit: Payer: Self-pay

## 2021-04-07 ENCOUNTER — Emergency Department (HOSPITAL_BASED_OUTPATIENT_CLINIC_OR_DEPARTMENT_OTHER)
Admission: EM | Admit: 2021-04-07 | Discharge: 2021-04-07 | Disposition: A | Discharge status: Other Acute Inpatient Hospital | Payer: Federal, State, Local not specified - PPO | Attending: Emergency Medicine | Admitting: Emergency Medicine

## 2021-04-07 ENCOUNTER — Encounter (HOSPITAL_BASED_OUTPATIENT_CLINIC_OR_DEPARTMENT_OTHER): Payer: Self-pay | Admitting: Emergency Medicine

## 2021-04-07 DIAGNOSIS — K922 Gastrointestinal hemorrhage, unspecified: Secondary | ICD-10-CM | POA: Insufficient documentation

## 2021-04-07 DIAGNOSIS — Z7901 Long term (current) use of anticoagulants: Secondary | ICD-10-CM | POA: Diagnosis not present

## 2021-04-07 DIAGNOSIS — K921 Melena: Secondary | ICD-10-CM | POA: Diagnosis present

## 2021-04-07 LAB — HEPATIC FUNCTION PANEL
ALT: 19 U/L (ref 0–44)
AST: 26 U/L (ref 15–41)
Albumin: 4.1 g/dL (ref 3.5–5.0)
Alkaline Phosphatase: 49 U/L (ref 38–126)
Bilirubin, Direct: 0.1 mg/dL (ref 0.0–0.2)
Indirect Bilirubin: 0.4 mg/dL (ref 0.3–0.9)
Total Bilirubin: 0.5 mg/dL (ref 0.3–1.2)
Total Protein: 7.4 g/dL (ref 6.5–8.1)

## 2021-04-07 LAB — BASIC METABOLIC PANEL
Anion gap: 7 (ref 5–15)
BUN: 22 mg/dL — ABNORMAL HIGH (ref 6–20)
CO2: 27 mmol/L (ref 22–32)
Calcium: 9.2 mg/dL (ref 8.9–10.3)
Chloride: 104 mmol/L (ref 98–111)
Creatinine, Ser: 1.4 mg/dL — ABNORMAL HIGH (ref 0.61–1.24)
GFR, Estimated: 58 mL/min — ABNORMAL LOW (ref >60)
Glucose, Bld: 98 mg/dL (ref 70–99)
Potassium: 4 mmol/L (ref 3.5–5.1)
Sodium: 138 mmol/L (ref 135–145)

## 2021-04-07 LAB — CBC
HCT: 40.1 % (ref 39.0–52.0)
Hemoglobin: 12.9 g/dL — ABNORMAL LOW (ref 13.0–17.0)
MCH: 27.6 pg (ref 26.0–34.0)
MCHC: 32.2 g/dL (ref 30.0–36.0)
MCV: 85.9 fL (ref 80.0–100.0)
Platelets: 174 10*3/uL (ref 150–400)
RBC: 4.67 MIL/uL (ref 4.22–5.81)
RDW: 15.5 % (ref 11.5–15.5)
WBC: 5.2 10*3/uL (ref 4.0–10.5)
nRBC: 0 % (ref 0.0–0.2)

## 2021-04-07 LAB — PROTIME-INR
INR: 1.1 (ref 0.8–1.2)
Prothrombin Time: 14 seconds (ref 11.4–15.2)

## 2021-04-07 LAB — OCCULT BLOOD X 1 CARD TO LAB, STOOL: Fecal Occult Bld: POSITIVE — AB

## 2021-04-07 MED ORDER — PANTOPRAZOLE SODIUM 40 MG IV SOLR
40.0000 mg | Freq: Two times a day (BID) | INTRAVENOUS | Status: DC
Start: 2021-04-07 — End: 2021-04-07
  Administered 2021-04-07: 40 mg via INTRAVENOUS
  Filled 2021-04-07: qty 10

## 2021-04-07 NOTE — ED Notes (Signed)
Transport given report. ETA 20 mins ?

## 2021-04-07 NOTE — ED Notes (Signed)
[employer] at bedside.

## 2021-04-07 NOTE — ED Provider Notes (Signed)
?[facility] EMERGENCY DEPARTMENT ?Provider Note ? ? ?CSN: [id number] ?Arrival date & time: 04/07/21  1112 ? ?  ? ?History ? ?Chief Complaint  ?Patient presents with  ? Blood In Stools  ? ? ?Michael Castillo is a 56 y.o. male. ? ?HPI ? ?Patient is a 56 year old male presented emergency room today with complaints of bloody stool with dark tarry appearance of poop. ? ?He states he had 1 occurrence of this today.  He states he has some mild epigastric discomfort but denies any fevers, chest pain, lower abdominal pain, lightheadedness, dizziness, shortness of breath. ? ?He states that December/January of this year he had similar symptoms was admitted to [facility] discharged home and then began having more bloody stools and went to [facility] where he was admitted had colonoscopy and EGD as well as CT angiograph and no source of bleeding was found. ? ? ? ? ? ?  ? ?Home Medications ?Prior to Admission medications   ?Medication Sig Start Date End Date Taking? Authorizing Provider  ?atorvastatin (LIPITOR) 40 MG tablet Take 40 mg by mouth daily. 12/27/20   [provider]  ?ELIQUIS 5 MG TABS tablet Take 5 mg by mouth 2 (two) times daily. 03/30/21   [provider]  ?levothyroxine (SYNTHROID) 112 MCG tablet Take 112 mcg by mouth daily. 12/17/20   [provider]  ?   ? ?Allergies    ?Patient has no known allergies.   ? ?Review of Systems   ?Review of Systems ? ?Physical Exam ?Updated Vital Signs ?BP 133/75   Pulse 77   Temp 98.6 ?F (37 ?C) (Oral)   Resp 16   Ht 6\' 2"  (1.88 m)   Wt 117 kg   SpO2 99%   BMI 33.13 kg/m?  ?Physical Exam ?Vitals and nursing note reviewed.  ?Constitutional:   ?   General: He is not in acute distress. ?HENT:  ?   Head: Normocephalic and atraumatic.  ?   Nose: Nose normal.  ?   Mouth/Throat:  ?   Mouth: Mucous membranes are moist.  ?Eyes:  ?   General: No scleral icterus. ?Cardiovascular:  ?   Rate and Rhythm: Normal rate and regular rhythm.  ?   Pulses:  Normal pulses.  ?   Heart sounds: Normal heart sounds.  ?Pulmonary:  ?   Effort: Pulmonary effort is normal. No respiratory distress.  ?   Breath sounds: Normal breath sounds. No wheezing.  ?Abdominal:  ?   Palpations: Abdomen is soft.  ?   Tenderness: There is no abdominal tenderness. There is no guarding or rebound.  ?Genitourinary: ?   Comments: Tarry appearance of stool on rectal exam.  Purple liquidy stool as well ?Musculoskeletal:  ?   Cervical back: Normal range of motion.  ?   Right lower leg: No edema.  ?   Left lower leg: No edema.  ?Skin: ?   General: Skin is warm and dry.  ?   Capillary Refill: Capillary refill takes less than 2 seconds.  ?Neurological:  ?   Mental Status: He is alert. Mental status is at baseline.  ?Psychiatric:     ?   Mood and Affect: Mood normal.     ?   Behavior: Behavior normal.  ? ? ?ED Results / Procedures / Treatments   ?Labs ?(all labs ordered are listed, but only abnormal results are displayed) ?Labs Reviewed  ?CBC - Abnormal; Notable for the following components:  ?    Result  Value  ? Hemoglobin 12.9 (*)   ? All other components within normal limits  ?BASIC METABOLIC PANEL - Abnormal; Notable for the following components:  ? BUN 22 (*)   ? Creatinine, Ser 1.40 (*)   ? GFR, Estimated 58 (*)   ? All other components within normal limits  ?OCCULT BLOOD X 1 CARD TO LAB, STOOL - Abnormal; Notable for the following components:  ? Fecal Occult Bld POSITIVE (*)   ? All other components within normal limits  ?HEPATIC FUNCTION PANEL  ?PROTIME-INR  ? ? ?EKG ?None ? ?Radiology ?No results found. ? ?Procedures ?Procedures  ? ? ?Medications Ordered in ED ?Medications  ?pantoprazole (PROTONIX) injection 40 mg (40 mg Intravenous Given 04/07/21 1339)  ? ? ?ED Course/ Medical Decision Making/ A&P ?Clinical Course as of 04/07/21 1432  ?Sat Apr 07, 2021  ?1155 13.8 (three weeks ago) [WF]  ?1412 Discussed with PAL line.  Will call me back with hospitalist to discuss admission at [facility].  [facility] [WF]  ?1414 Fecal Occult Blood, POC(!): POSITIVE [WF]  ?1429 Discussed with Dr. Reather Laurence [WF]  ?  ?Clinical Course User Index ?[WF] Michael Castillo, [place]  ? ?                        ?Medical Decision Making ?Amount and/or Complexity of Data Reviewed ?Labs: ordered. Decision-making details documented in ED Course. ? ?Risk ?Prescription drug management. ?Decision regarding hospitalization. ? ? ?This patient presents to the ED for concern of bloody stools currently taking Xarelto, this involves a number of treatment options, and is a complaint that carries with it a high risk of complications and morbidity.  The differential diagnosis includes gastric ulcer, intestinal bleeding, diverticulosis, diverticulitis ? ? ?Co morbidities: ?Discussed in HPI ? ? ?Brief History: ? ? ?Patient is a 56 year old male presented emergency room today with complaints of bloody stool with dark tarry appearance of poop. ? ?He states he had 1 occurrence of this today.  He states he has some mild epigastric discomfort but denies any fevers, chest pain, lower abdominal pain, lightheadedness, dizziness, shortness of breath. ? ?He states that December/January of this year he had similar symptoms was admitted to [facility] discharged home and then began having more bloody stools and went to [facility] where he was admitted had colonoscopy and EGD as well as CT angiograph and no source of bleeding was found. ? ?Physical exam unremarkable.  Will start on Protonix.  Rectal exam with dark tarry poop with some purple stool that is liquid ? ? ?EMR reviewed including pt PMHx, past surgical history and past visits to ER.  ? ?See HPI for more details ? ? ?Lab Tests: ? ? ?I ordered and independently interpreted labs. Labs notable for mild anemia hemoglobin now 12.9 trended down from 13.83 weeks ago.  LFTs INR unremarkable BUN elevated on BMP consistent with upper GI bleed.  Fecal occult positive. ? ? ?Imaging  Studies: ? ?No imaging studies ordered for this patient ? ? ? ?Cardiac Monitoring: ? ?NA ?NA ? ? ?Medicines ordered: ? ?I ordered medication including pantoprazole 40 mg twice daily for upper GI bleed ?Reevaluation of the patient after these medicines showed that the patient stayed the same ?I have reviewed the patients home medicines and have made adjustments as needed ? ? ?Critical Interventions: ? ? ? ? ?Consults/Attending Physician ? ? ?I requested consultation with Dr. Reather Laurence,  and discussed lab and  imaging findings as well as pertinent plan - they recommend: admission at [place] ? ?Secretary aware.  Charge nurse aware. ? ?I discussed this case with my attending physician who cosigned this note including patient's presenting symptoms, physical exam, and planned diagnostics and interventions. Attending physician stated agreement with plan or made changes to plan which were implemented.  ? ?Attending physician assessed patient at bedside.  ? ?Reevaluation: ? ?After the interventions noted above I re-evaluated patient and found that they have :stayed the same ? ? ?Social Determinants of Health: ? ?The patient's social determinants of health were a factor in the care of this patient ? ? ? ?Problem List / ED Course: ? ?Upper GI bleed ? ? ?Dispostion: ? ?After consideration of the diagnostic results and the patients response to treatment, I feel that the patent would benefit from admission -they have requested admission at [facility]. ?  ? ? ?Final Clinical Impression(s) / ED Diagnoses ?Final diagnoses:  ?Upper GI bleed  ? ? ?Rx / DC Orders ?ED Discharge Orders   ? ? None  ? ?  ? ? ?  ?Michael Castillo, [place] ?04/07/21 1528 ? ?  ?Varney Biles, MD ?04/09/21 [phone] ? ?

## 2021-04-07 NOTE — ED Notes (Addendum)
Pt reports dark blood in stool that started today. Denies pain. Pt taking eliquis. ?

## 2021-04-07 NOTE — ED Notes (Signed)
Called the Pal's line for transfer to [facility] at 1:39 ?

## 2021-04-07 NOTE — ED Triage Notes (Signed)
Pt arrives pov, steady gait to triage, c/o dark blood in stool today. Endorses hx of same in December 2022. Pt denies abdominal pain ?

## 2021-04-07 NOTE — ED Notes (Signed)
Report given to accepting RN [phone] [facility]. All questions and concerns addressed.  ?

## 2022-10-23 IMAGING — US US EXTREM LOW VENOUS
1 series · 13 of 24 positions shown · non-contrast
Comparison: None.

CLINICAL DATA: Pulmonary embolism



[Series 1: us extrem low venous · 0.07mm/px · 13 of 70 slices shown]
[im 1/70]
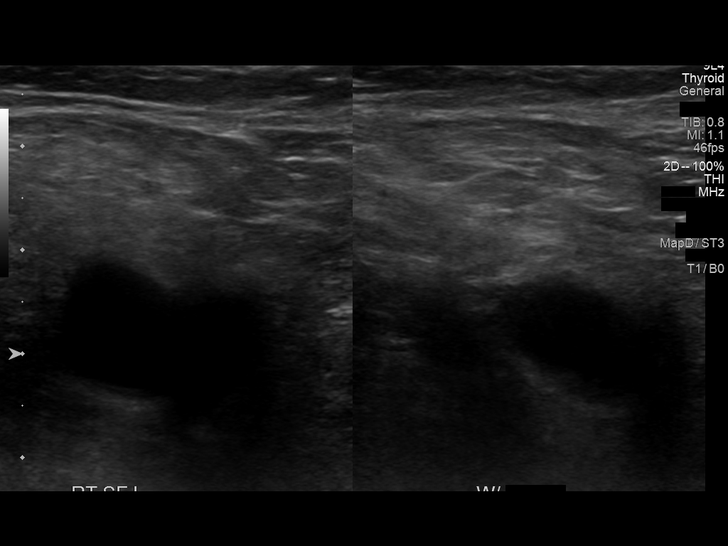
[im 7/70]
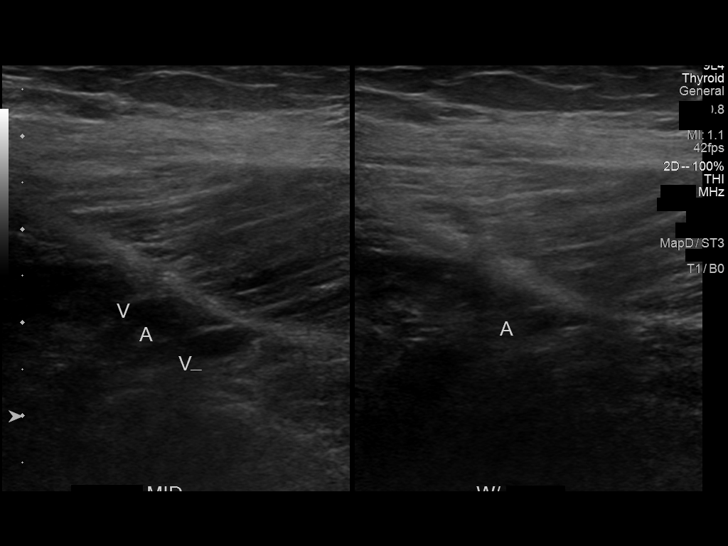
[im 13/70]
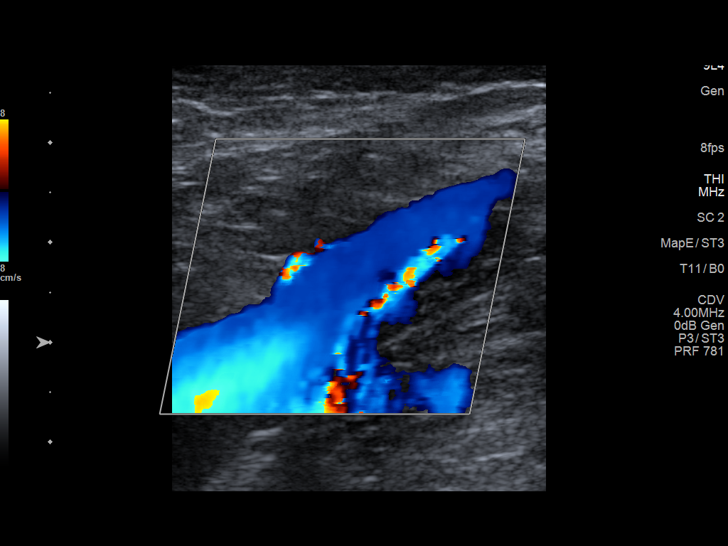
[im 19/70]
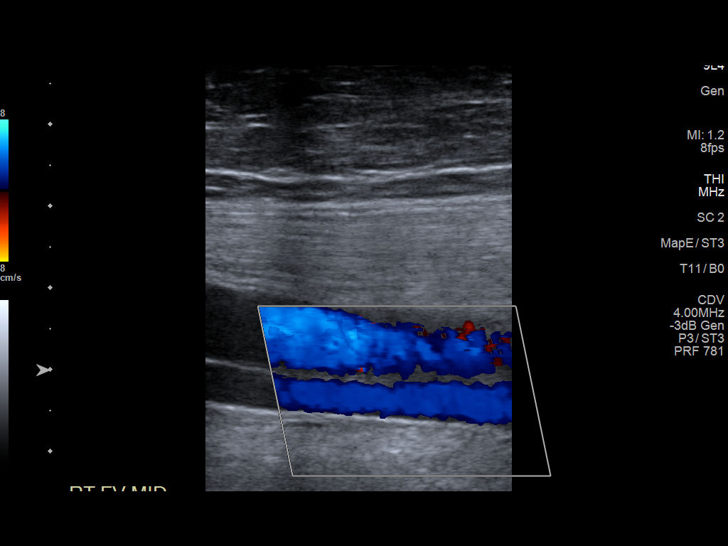
[im 25/70]
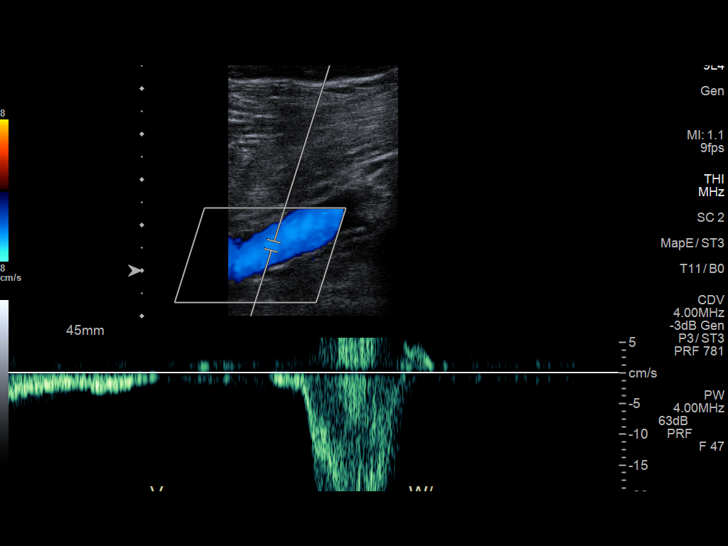
[im 31/70]
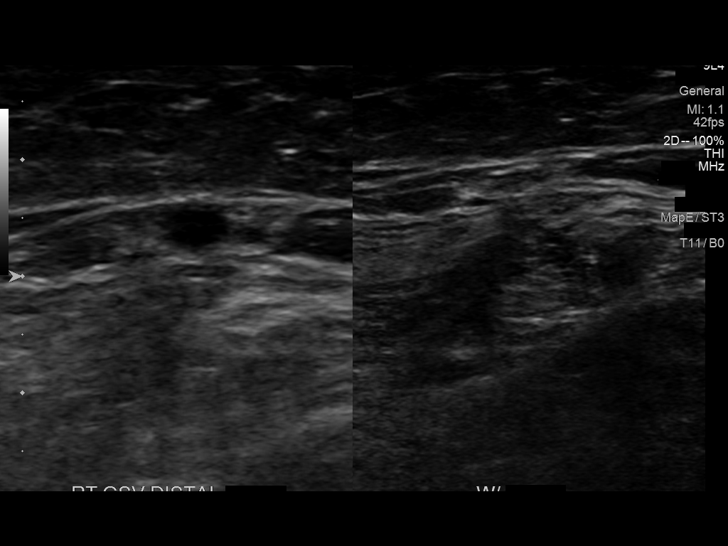
[im 37/70]
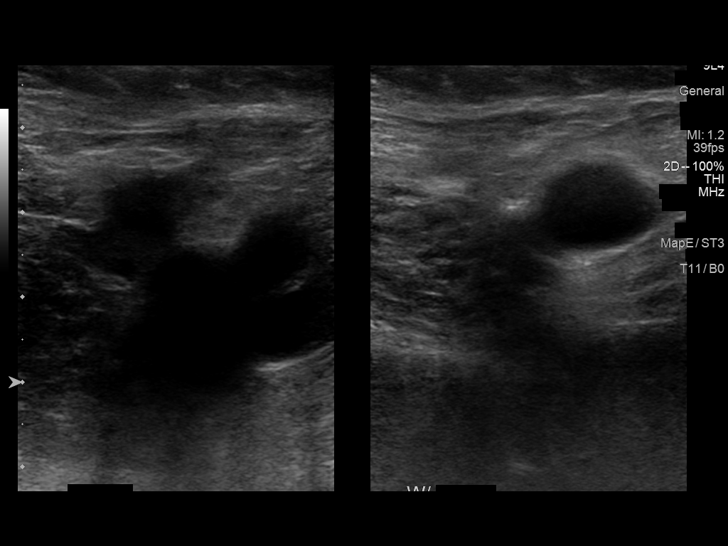
[im 40/70]
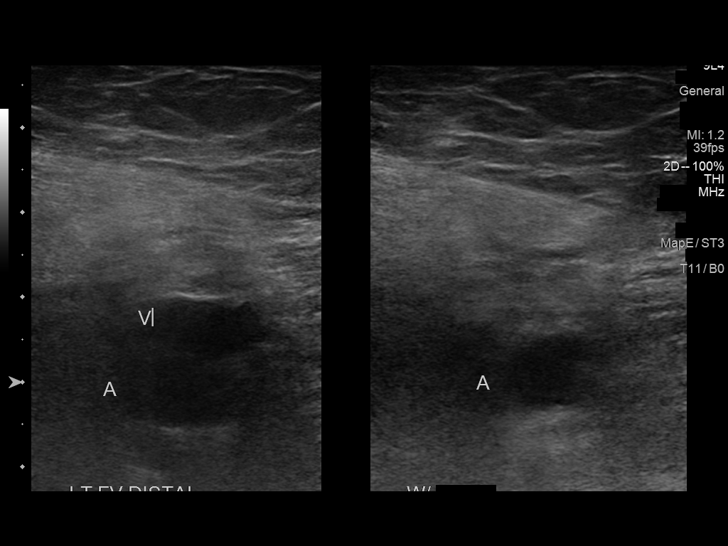
[im 46/70]
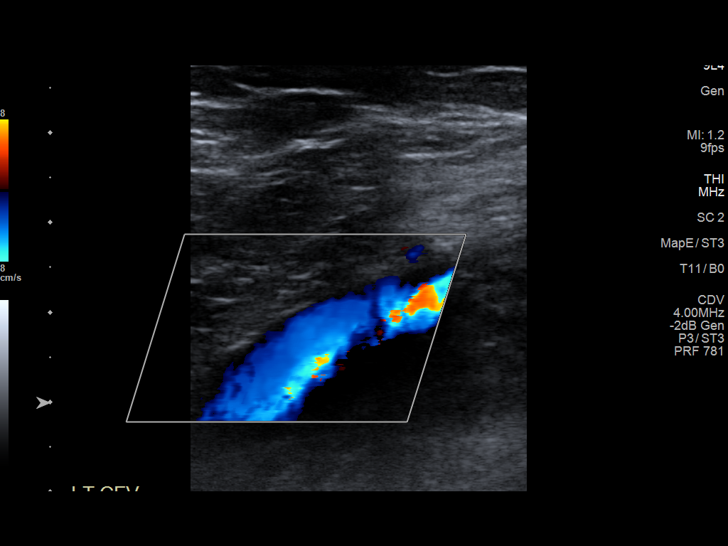
[im 52/70]
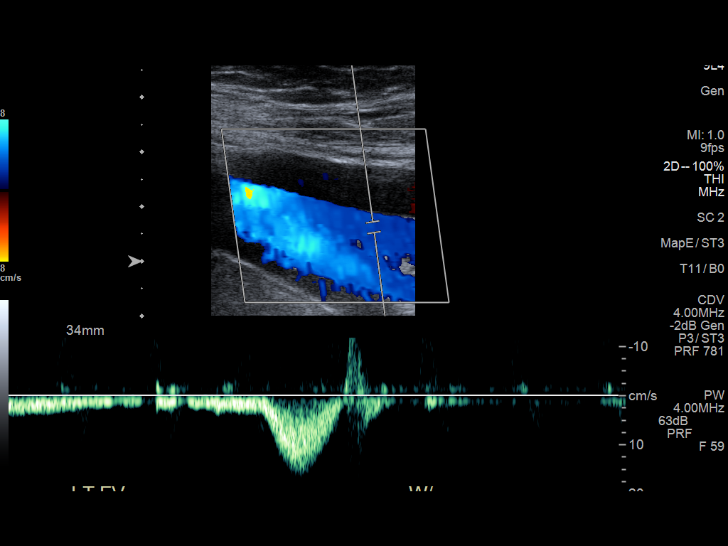
[im 58/70]
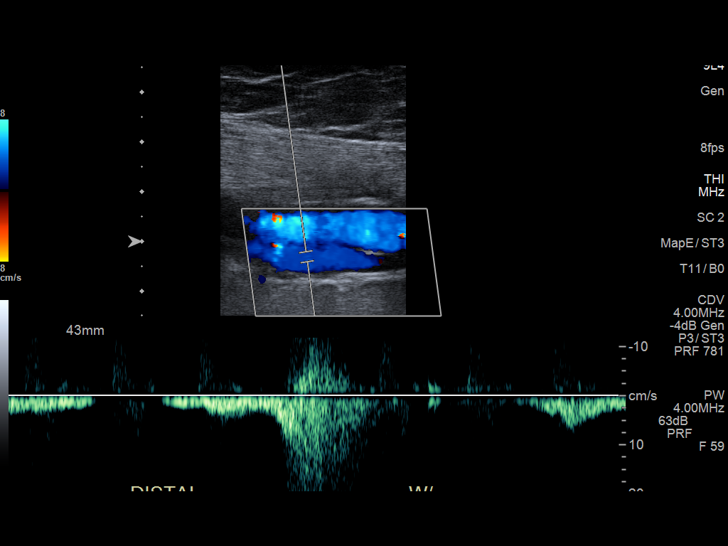
[im 64/70]
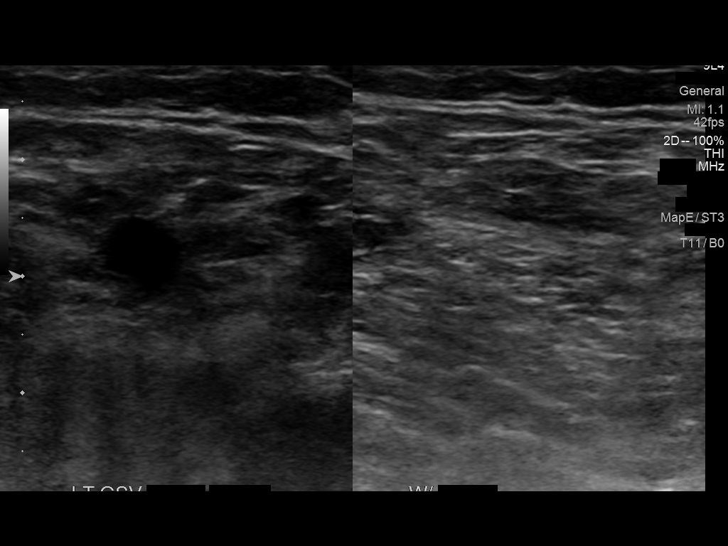
[im 70/70]
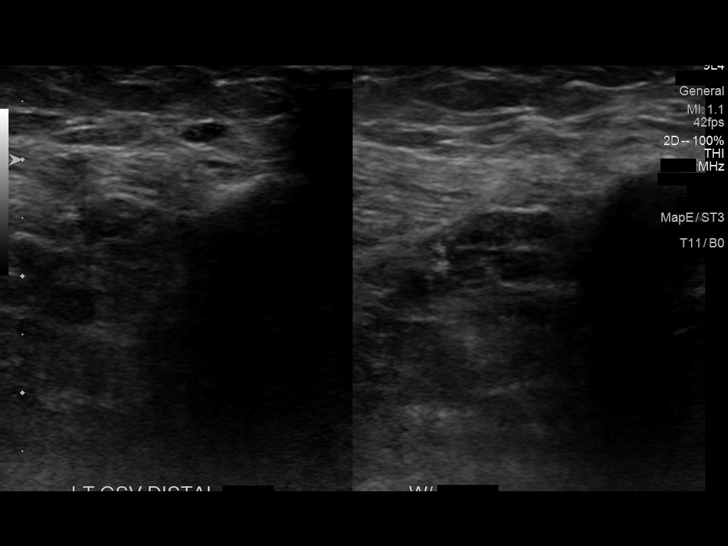

[13 of 24 positions shown; findings below may reference images not displayed]

FINDINGS: RIGHT LOWER EXTREMITY

Common Femoral Vein: No evidence of thrombus. Normal
compressibility, respiratory phasicity and response to augmentation.

Saphenofemoral Junction: No evidence of thrombus. Normal
compressibility and flow on color Doppler imaging.

Profunda Femoral Vein: No evidence of thrombus. Normal
compressibility and flow on color Doppler imaging.

Femoral Vein: No evidence of thrombus. Normal compressibility,
respiratory phasicity and response to augmentation.

Popliteal Vein: No evidence of thrombus. Normal compressibility,
respiratory phasicity and response to augmentation.

Calf Veins: No evidence of thrombus. Normal compressibility and flow
on color Doppler imaging.

Superficial Great Saphenous Vein: No evidence of thrombus. Normal
compressibility.

Venous Reflux:  None.

Other Findings:  None.

LEFT LOWER EXTREMITY

Common Femoral Vein: No evidence of thrombus. Normal
compressibility, respiratory phasicity and response to augmentation.

Saphenofemoral Junction: No evidence of thrombus. Normal
compressibility and flow on color Doppler imaging.

Profunda Femoral Vein: No evidence of thrombus. Normal
compressibility and flow on color Doppler imaging.

Femoral Vein: No evidence of thrombus. Normal compressibility,
respiratory phasicity and response to augmentation.

Popliteal Vein: No evidence of thrombus. Normal compressibility,
respiratory phasicity and response to augmentation.

Calf Veins: No evidence of thrombus. Normal compressibility and flow
on color Doppler imaging.

Superficial Great Saphenous Vein: No evidence of thrombus. Normal
compressibility.

Venous Reflux:  None.

Other Findings: There is limited evaluation of peroneal veins in
both calves.
IMPRESSION: No evidence of deep venous thrombosis in either lower extremity.
